# Patient Record
Sex: Female | Born: 1980
Health system: Southern US, Community
[De-identification: ages and names within clinical notes are randomized; demographics above are authoritative.]

---

## 2007-10-31 ENCOUNTER — Emergency Department (HOSPITAL_COMMUNITY): Admission: EM | Admit: 2007-10-31 | Discharge: 2007-10-31 | Payer: Self-pay | Admitting: Family Medicine

## 2007-11-01 ENCOUNTER — Emergency Department (HOSPITAL_COMMUNITY): Admission: EM | Admit: 2007-11-01 | Discharge: 2007-11-01 | Payer: Self-pay | Admitting: Family Medicine

## 2007-11-03 ENCOUNTER — Emergency Department (HOSPITAL_COMMUNITY): Admission: EM | Admit: 2007-11-03 | Discharge: 2007-11-03 | Payer: Self-pay | Admitting: Family Medicine

## 2007-11-05 ENCOUNTER — Emergency Department (HOSPITAL_COMMUNITY): Admission: EM | Admit: 2007-11-05 | Discharge: 2007-11-05 | Payer: Self-pay | Admitting: Family Medicine

## 2009-09-20 ENCOUNTER — Inpatient Hospital Stay (HOSPITAL_COMMUNITY): Admission: AD | Admit: 2009-09-20 | Discharge: 2009-09-22 | Payer: Self-pay | Admitting: Obstetrics and Gynecology

## 2010-04-29 LAB — RPR: RPR Ser Ql: NONREACTIVE

## 2010-04-29 LAB — CBC
HCT: 31.9 % — ABNORMAL LOW (ref 36.0–46.0)
Hemoglobin: 10.8 g/dL — ABNORMAL LOW (ref 12.0–15.0)
Hemoglobin: 12.9 g/dL (ref 12.0–15.0)
MCH: 31.4 pg (ref 26.0–34.0)
MCH: 31.5 pg (ref 26.0–34.0)
MCHC: 33.8 g/dL (ref 30.0–36.0)
MCHC: 34.2 g/dL (ref 30.0–36.0)
RDW: 13.9 % (ref 11.5–15.5)
RDW: 14.1 % (ref 11.5–15.5)

## 2010-11-14 LAB — CULTURE, ROUTINE-ABSCESS

## 2013-11-11 ENCOUNTER — Other Ambulatory Visit: Payer: Self-pay | Admitting: Family Medicine

## 2013-11-11 DIAGNOSIS — N63 Unspecified lump in unspecified breast: Secondary | ICD-10-CM

## 2013-11-13 ENCOUNTER — Other Ambulatory Visit: Payer: Self-pay | Admitting: Family Medicine

## 2013-11-13 DIAGNOSIS — N6011 Diffuse cystic mastopathy of right breast: Secondary | ICD-10-CM

## 2013-11-13 DIAGNOSIS — N63 Unspecified lump in unspecified breast: Secondary | ICD-10-CM

## 2013-11-14 ENCOUNTER — Ambulatory Visit
Admission: RE | Admit: 2013-11-14 | Discharge: 2013-11-14 | Disposition: A | Discharge status: Home / Self Care | Payer: 59 | Source: Ambulatory Visit | Attending: Family Medicine | Admitting: Family Medicine

## 2013-11-14 DIAGNOSIS — N6011 Diffuse cystic mastopathy of right breast: Secondary | ICD-10-CM

## 2013-11-14 DIAGNOSIS — N63 Unspecified lump in unspecified breast: Secondary | ICD-10-CM

## 2015-02-26 DIAGNOSIS — R5381 Other malaise: Secondary | ICD-10-CM | POA: Diagnosis not present

## 2015-02-26 DIAGNOSIS — R5383 Other fatigue: Secondary | ICD-10-CM | POA: Diagnosis not present

## 2015-03-31 DIAGNOSIS — R5383 Other fatigue: Secondary | ICD-10-CM | POA: Diagnosis not present

## 2015-03-31 MED FILL — PHENTERMINE 37.5 MG TABLET: 37.5 | 30 days supply | Qty: 30 | Fill #0

## 2015-04-29 DIAGNOSIS — R5383 Other fatigue: Secondary | ICD-10-CM | POA: Diagnosis not present

## 2015-04-30 MED FILL — PHENTERMINE 37.5 MG TABLET: 37.5 | 30 days supply | Qty: 30 | Fill #0

## 2015-06-03 DIAGNOSIS — R5383 Other fatigue: Secondary | ICD-10-CM | POA: Diagnosis not present

## 2015-06-09 MED FILL — FLUTICASONE PROP 50 MCG SPR: 50 | 30 days supply | Qty: 16 | Fill #0

## 2015-07-01 MED FILL — NUVARING VAGINAL RING: 0.12-0.015 | 84 days supply | Qty: 3 | Fill #0

## 2015-08-30 DIAGNOSIS — Z6832 Body mass index (BMI) 32.0-32.9, adult: Secondary | ICD-10-CM | POA: Diagnosis not present

## 2015-08-30 DIAGNOSIS — R87610 Atypical squamous cells of undetermined significance on cytologic smear of cervix (ASC-US): Secondary | ICD-10-CM | POA: Diagnosis not present

## 2015-08-30 DIAGNOSIS — Z01419 Encounter for gynecological examination (general) (routine) without abnormal findings: Secondary | ICD-10-CM | POA: Diagnosis not present

## 2015-08-30 DIAGNOSIS — F5101 Primary insomnia: Secondary | ICD-10-CM | POA: Diagnosis not present

## 2015-08-30 DIAGNOSIS — E669 Obesity, unspecified: Secondary | ICD-10-CM | POA: Diagnosis not present

## 2015-09-01 ENCOUNTER — Other Ambulatory Visit (HOSPITAL_COMMUNITY)
Admission: RE | Admit: 2015-09-01 | Discharge: 2015-09-01 | Disposition: A | Discharge status: Home / Self Care | Payer: 59 | Source: Ambulatory Visit | Attending: Family Medicine | Admitting: Family Medicine

## 2015-09-01 DIAGNOSIS — Z01419 Encounter for gynecological examination (general) (routine) without abnormal findings: Secondary | ICD-10-CM | POA: Diagnosis not present

## 2015-09-01 LAB — CBC WITH DIFFERENTIAL/PLATELET
BASOS ABS: 0 10*3/uL (ref 0.0–0.1)
Basophils Relative: 1 %
EOS PCT: 4 %
Eosinophils Absolute: 0.3 10*3/uL (ref 0.0–0.7)
HEMATOCRIT: 40.9 % (ref 36.0–46.0)
Hemoglobin: 13.8 g/dL (ref 12.0–15.0)
LYMPHS ABS: 2.4 10*3/uL (ref 0.7–4.0)
LYMPHS PCT: 36 %
MCH: 29 pg (ref 26.0–34.0)
MCHC: 33.7 g/dL (ref 30.0–36.0)
MCV: 85.9 fL (ref 78.0–100.0)
MONO ABS: 0.3 10*3/uL (ref 0.1–1.0)
Monocytes Relative: 5 %
NEUTROS ABS: 3.6 10*3/uL (ref 1.7–7.7)
Neutrophils Relative %: 54 %
PLATELETS: 223 10*3/uL (ref 150–400)
RBC: 4.76 MIL/uL (ref 3.87–5.11)
RDW: 13.2 % (ref 11.5–15.5)
WBC: 6.6 10*3/uL (ref 4.0–10.5)

## 2015-09-01 LAB — BASIC METABOLIC PANEL
Anion gap: 6 (ref 5–15)
BUN: 17 mg/dL (ref 6–20)
CHLORIDE: 107 mmol/L (ref 101–111)
CO2: 25 mmol/L (ref 22–32)
Calcium: 9.1 mg/dL (ref 8.9–10.3)
Creatinine, Ser: 0.91 mg/dL (ref 0.44–1.00)
GLUCOSE: 100 mg/dL — AB (ref 65–99)
Potassium: 4.2 mmol/L (ref 3.5–5.1)
Sodium: 138 mmol/L (ref 135–145)

## 2015-09-01 LAB — LIPID PANEL
CHOL/HDL RATIO: 3.6 ratio
Cholesterol: 214 mg/dL — ABNORMAL HIGH (ref 0–200)
HDL: 60 mg/dL (ref >40)
LDL CALC: 126 mg/dL — AB (ref 0–99)
Triglycerides: 142 mg/dL (ref <150)
VLDL: 28 mg/dL (ref 0–40)

## 2015-09-02 DIAGNOSIS — R5383 Other fatigue: Secondary | ICD-10-CM | POA: Diagnosis not present

## 2015-09-02 DIAGNOSIS — Z6832 Body mass index (BMI) 32.0-32.9, adult: Secondary | ICD-10-CM | POA: Diagnosis not present

## 2015-09-02 LAB — HEMOGLOBIN A1C
Hgb A1c MFr Bld: 5.3 % (ref 4.8–5.6)
Mean Plasma Glucose: 105 mg/dL

## 2015-09-22 MED FILL — traZODone HCL 50 MG TABS: 50 | 30 days supply | Qty: 30 | Fill #0

## 2015-09-27 DIAGNOSIS — R87628 Other abnormal cytological findings on specimens from vagina: Secondary | ICD-10-CM | POA: Diagnosis not present

## 2015-09-30 DIAGNOSIS — R5383 Other fatigue: Secondary | ICD-10-CM | POA: Diagnosis not present

## 2015-09-30 DIAGNOSIS — Z6830 Body mass index (BMI) 30.0-30.9, adult: Secondary | ICD-10-CM | POA: Diagnosis not present

## 2015-10-06 MED FILL — PHENTERMINE 37.5 MG TABLET: 37.5 | 30 days supply | Qty: 30 | Fill #0

## 2015-11-17 MED FILL — NUVARING VAGINAL RING: 0.12-0.015 | 84 days supply | Qty: 3 | Fill #0

## 2015-11-25 MED FILL — SCOPOLAMINE 1 MG/3 DAY PATC: 1 | 3 days supply | Qty: 2 | Fill #0

## 2015-11-25 MED FILL — MECLIZINE 25 MG TABLET: 25 | 7 days supply | Qty: 30 | Fill #0

## 2015-11-29 MED FILL — FLUTICASONE PROP 50 MCG SPR: 50 | 30 days supply | Qty: 16 | Fill #1

## 2015-11-30 DIAGNOSIS — R102 Pelvic and perineal pain: Secondary | ICD-10-CM | POA: Diagnosis not present

## 2016-01-20 DIAGNOSIS — H1089 Other conjunctivitis: Secondary | ICD-10-CM | POA: Diagnosis not present

## 2016-01-20 DIAGNOSIS — H16292 Other keratoconjunctivitis, left eye: Secondary | ICD-10-CM | POA: Diagnosis not present

## 2016-01-20 DIAGNOSIS — H168 Other keratitis: Secondary | ICD-10-CM | POA: Diagnosis not present

## 2016-01-20 MED FILL — NEO/POLYMYXIN/DEXAMETH DROP: 3.5-10000-0 | 8 days supply | Qty: 5 | Fill #0

## 2016-01-26 MED FILL — FLUTICASONE PROP 50 MCG SPR: 50 | 30 days supply | Qty: 16 | Fill #2

## 2016-03-21 DIAGNOSIS — Z6833 Body mass index (BMI) 33.0-33.9, adult: Secondary | ICD-10-CM | POA: Diagnosis not present

## 2016-03-21 DIAGNOSIS — R5381 Other malaise: Secondary | ICD-10-CM | POA: Diagnosis not present

## 2016-03-21 DIAGNOSIS — K5909 Other constipation: Secondary | ICD-10-CM | POA: Diagnosis not present

## 2016-04-18 DIAGNOSIS — R5383 Other fatigue: Secondary | ICD-10-CM | POA: Diagnosis not present

## 2016-04-18 DIAGNOSIS — Z6831 Body mass index (BMI) 31.0-31.9, adult: Secondary | ICD-10-CM | POA: Diagnosis not present

## 2016-05-23 DIAGNOSIS — R5383 Other fatigue: Secondary | ICD-10-CM | POA: Diagnosis not present

## 2016-05-23 DIAGNOSIS — Z6831 Body mass index (BMI) 31.0-31.9, adult: Secondary | ICD-10-CM | POA: Diagnosis not present

## 2016-06-16 DIAGNOSIS — N3000 Acute cystitis without hematuria: Secondary | ICD-10-CM | POA: Diagnosis not present

## 2016-06-20 DIAGNOSIS — R5383 Other fatigue: Secondary | ICD-10-CM | POA: Diagnosis not present

## 2016-06-20 DIAGNOSIS — Z6831 Body mass index (BMI) 31.0-31.9, adult: Secondary | ICD-10-CM | POA: Diagnosis not present

## 2016-06-30 MED FILL — FLUTICASONE PROP 50 MCG SPR: 50 | 30 days supply | Qty: 16 | Fill #0

## 2016-07-11 MED FILL — BELVIQ 10 MG TABLET: 10 | 30 days supply | Qty: 60 | Fill #0

## 2016-08-09 MED FILL — BELVIQ 10 MG TABLET: 10 | 30 days supply | Qty: 60 | Fill #1

## 2016-09-04 DIAGNOSIS — Z113 Encounter for screening for infections with a predominantly sexual mode of transmission: Secondary | ICD-10-CM | POA: Diagnosis not present

## 2016-09-04 DIAGNOSIS — Z01411 Encounter for gynecological examination (general) (routine) with abnormal findings: Secondary | ICD-10-CM | POA: Diagnosis not present

## 2016-09-04 DIAGNOSIS — R87610 Atypical squamous cells of undetermined significance on cytologic smear of cervix (ASC-US): Secondary | ICD-10-CM | POA: Diagnosis not present

## 2016-09-04 DIAGNOSIS — R87622 Low grade squamous intraepithelial lesion on cytologic smear of vagina (LGSIL): Secondary | ICD-10-CM | POA: Diagnosis not present

## 2016-09-04 DIAGNOSIS — Z1151 Encounter for screening for human papillomavirus (HPV): Secondary | ICD-10-CM | POA: Diagnosis not present

## 2016-09-04 DIAGNOSIS — Z118 Encounter for screening for other infectious and parasitic diseases: Secondary | ICD-10-CM | POA: Diagnosis not present

## 2016-09-04 DIAGNOSIS — Z6833 Body mass index (BMI) 33.0-33.9, adult: Secondary | ICD-10-CM | POA: Diagnosis not present

## 2016-09-04 DIAGNOSIS — Z01419 Encounter for gynecological examination (general) (routine) without abnormal findings: Secondary | ICD-10-CM | POA: Diagnosis not present

## 2016-09-06 ENCOUNTER — Other Ambulatory Visit (HOSPITAL_COMMUNITY)
Admission: RE | Admit: 2016-09-06 | Discharge: 2016-09-06 | Disposition: A | Discharge status: Home / Self Care | Payer: 59 | Source: Ambulatory Visit | Attending: Family Medicine | Admitting: Family Medicine

## 2016-09-06 DIAGNOSIS — Z01419 Encounter for gynecological examination (general) (routine) without abnormal findings: Secondary | ICD-10-CM | POA: Diagnosis not present

## 2016-09-06 LAB — CBC WITH DIFFERENTIAL/PLATELET
BASOS ABS: 0 10*3/uL (ref 0.0–0.1)
BASOS PCT: 1 %
Eosinophils Absolute: 0.1 10*3/uL (ref 0.0–0.7)
Eosinophils Relative: 1 %
HEMATOCRIT: 39.5 % (ref 36.0–46.0)
HEMOGLOBIN: 13.5 g/dL (ref 12.0–15.0)
Lymphocytes Relative: 41 %
Lymphs Abs: 2.5 10*3/uL (ref 0.7–4.0)
MCH: 29.5 pg (ref 26.0–34.0)
MCHC: 34.2 g/dL (ref 30.0–36.0)
MCV: 86.4 fL (ref 78.0–100.0)
Monocytes Absolute: 0.3 10*3/uL (ref 0.1–1.0)
Monocytes Relative: 5 %
NEUTROS ABS: 3.1 10*3/uL (ref 1.7–7.7)
NEUTROS PCT: 52 %
Platelets: 226 10*3/uL (ref 150–400)
RBC: 4.57 MIL/uL (ref 3.87–5.11)
RDW: 13.7 % (ref 11.5–15.5)
WBC: 6 10*3/uL (ref 4.0–10.5)

## 2016-09-06 LAB — BASIC METABOLIC PANEL
ANION GAP: 7 (ref 5–15)
BUN: 16 mg/dL (ref 6–20)
CALCIUM: 9 mg/dL (ref 8.9–10.3)
CO2: 26 mmol/L (ref 22–32)
Chloride: 104 mmol/L (ref 101–111)
Creatinine, Ser: 0.79 mg/dL (ref 0.44–1.00)
Glucose, Bld: 108 mg/dL — ABNORMAL HIGH (ref 65–99)
Potassium: 4 mmol/L (ref 3.5–5.1)
SODIUM: 137 mmol/L (ref 135–145)

## 2016-09-06 LAB — LIPID PANEL
CHOLESTEROL: 193 mg/dL (ref 0–200)
HDL: 59 mg/dL (ref >40)
LDL CALC: 120 mg/dL — AB (ref 0–99)
TRIGLYCERIDES: 72 mg/dL (ref <150)
Total CHOL/HDL Ratio: 3.3 RATIO
VLDL: 14 mg/dL (ref 0–40)

## 2016-09-07 LAB — HEMOGLOBIN A1C
HEMOGLOBIN A1C: 5.4 % (ref 4.8–5.6)
Mean Plasma Glucose: 108 mg/dL

## 2016-11-17 MED FILL — NUVARING VAGINAL RING: 0.12-0.015 | 84 days supply | Qty: 3 | Fill #0

## 2017-03-01 ENCOUNTER — Telehealth: Payer: 59 | Admitting: Family

## 2017-03-01 DIAGNOSIS — B9789 Other viral agents as the cause of diseases classified elsewhere: Secondary | ICD-10-CM | POA: Diagnosis not present

## 2017-03-01 DIAGNOSIS — J329 Chronic sinusitis, unspecified: Secondary | ICD-10-CM

## 2017-03-01 MED ORDER — FLUTICASONE PROPIONATE 50 MCG/ACT NA SUSP
2.0000 | Freq: Every day | NASAL | 6 refills | Status: AC
Start: 2017-03-01 — End: ?

## 2017-03-01 MED ORDER — PREDNISONE 5 MG PO TABS: 5.0000 mg | ORAL_TABLET | ORAL | 0 refills | Status: DC

## 2017-03-01 NOTE — Progress Notes (Signed)
Thank you for the details you included in the comment boxes. Those details are very helpful in determining the best course of treatment for you and help us to provide the best care.  We are sorry that you are not feeling well.  Here is how we plan to help!  Based on what you have shared with me it looks like you have sinusitis.  Sinusitis is inflammation and infection in the sinus cavities of the head.  Based on your presentation I believe you most likely have Acute Viral Sinusitis.This is an infection most likely caused by a virus. There is not specific treatment for viral sinusitis other than to help you with the symptoms until the infection runs its course.  You may use an oral decongestant such as Mucinex D or if you have glaucoma or high blood pressure use plain Mucinex. Saline nasal spray help and can safely be used as often as needed for congestion, I have prescribed: Fluticasone nasal spray two sprays in each nostril once a day And a Prednisone pack for inflammation and pain to help with all of the discomfort you are having.   Some authorities believe that zinc sprays or the use of Echinacea may shorten the course of your symptoms.  Sinus infections are not as easily transmitted as other respiratory infection, however we still recommend that you avoid close contact with loved ones, especially the very young and elderly.  Remember to wash your hands thoroughly throughout the day as this is the number one way to prevent the spread of infection!  Home Care:  Only take medications as instructed by your medical team.  Complete the entire course of an antibiotic.  Do not take these medications with alcohol.  A steam or ultrasonic humidifier can help congestion.  You can place a towel over your head and breathe in the steam from hot water coming from a faucet.  Avoid close contacts especially the very young and the elderly.  Cover your mouth when you cough or sneeze.  Always remember to wash  your hands.  Get Help Right Away If:  You develop worsening fever or sinus pain.  You develop a severe head ache or visual changes.  Your symptoms persist after you have completed your treatment plan.  Make sure you  Understand these instructions.  Will watch your condition.  Will get help right away if you are not doing well or get worse.  Your e-visit answers were reviewed by a board certified advanced clinical practitioner to complete your personal care plan.  Depending on the condition, your plan could have included both over the counter or prescription medications.  If there is a problem please reply  once you have received a response from your provider.  Your safety is important to us.  If you have drug allergies check your prescription carefully.    You can use MyChart to ask questions about today's visit, request a non-urgent call back, or ask for a work or school excuse for 24 hours related to this e-Visit. If it has been greater than 24 hours you will need to follow up with your provider, or enter a new e-Visit to address those concerns.  You will get an e-mail in the next two days asking about your experience.  I hope that your e-visit has been valuable and will speed your recovery. Thank you for using e-visits.

## 2017-07-12 MED FILL — NUVARING VAGINAL RING: 0.12-0.015 | 84 days supply | Qty: 3 | Fill #1

## 2017-08-17 MED FILL — FLUTICASONE PROP 50 MCG SPR: 50 | 30 days supply | Qty: 16 | Fill #0

## 2017-09-06 MED FILL — GENTAK 3 MG/ML EYE DROPS: 0.3 | 15 days supply | Qty: 15 | Fill #0

## 2017-09-08 ENCOUNTER — Other Ambulatory Visit (HOSPITAL_COMMUNITY)
Admit: 2017-09-08 | Discharge: 2017-09-08 | Disposition: A | Discharge status: Home / Self Care | Payer: No Typology Code available for payment source | Source: Other Acute Inpatient Hospital | Attending: Family Medicine | Admitting: Family Medicine

## 2017-09-08 DIAGNOSIS — E669 Obesity, unspecified: Secondary | ICD-10-CM | POA: Diagnosis present

## 2017-09-08 DIAGNOSIS — Z01419 Encounter for gynecological examination (general) (routine) without abnormal findings: Secondary | ICD-10-CM | POA: Diagnosis not present

## 2017-09-08 LAB — BASIC METABOLIC PANEL
Anion gap: 7 (ref 5–15)
BUN: 18 mg/dL (ref 6–20)
CHLORIDE: 106 mmol/L (ref 98–111)
CO2: 26 mmol/L (ref 22–32)
Calcium: 9.2 mg/dL (ref 8.9–10.3)
Creatinine, Ser: 0.89 mg/dL (ref 0.44–1.00)
GFR calc Af Amer: >60 mL/min (ref >60)
GFR calc non Af Amer: >60 mL/min (ref >60)
GLUCOSE: 106 mg/dL — AB (ref 70–99)
POTASSIUM: 4.1 mmol/L (ref 3.5–5.1)
Sodium: 139 mmol/L (ref 135–145)

## 2017-09-08 LAB — LIPID PANEL
Cholesterol: 216 mg/dL — ABNORMAL HIGH (ref 0–200)
HDL: 61 mg/dL (ref >40)
LDL Cholesterol: 141 mg/dL — ABNORMAL HIGH (ref 0–99)
TRIGLYCERIDES: 68 mg/dL (ref <150)
Total CHOL/HDL Ratio: 3.5 RATIO
VLDL: 14 mg/dL (ref 0–40)

## 2017-09-08 LAB — CBC
HCT: 42.5 % (ref 36.0–46.0)
HEMOGLOBIN: 14.2 g/dL (ref 12.0–15.0)
MCH: 29.2 pg (ref 26.0–34.0)
MCHC: 33.4 g/dL (ref 30.0–36.0)
MCV: 87.4 fL (ref 78.0–100.0)
Platelets: 201 10*3/uL (ref 150–400)
RBC: 4.86 MIL/uL (ref 3.87–5.11)
RDW: 13.7 % (ref 11.5–15.5)
WBC: 4.7 10*3/uL (ref 4.0–10.5)

## 2017-09-08 LAB — HEMOGLOBIN A1C
Hgb A1c MFr Bld: 5.4 % (ref 4.8–5.6)
MEAN PLASMA GLUCOSE: 108.28 mg/dL

## 2017-10-24 MED FILL — ZYLET EYE DROPS: 0.5-0.3 | 25 days supply | Qty: 5 | Fill #0

## 2018-02-07 MED FILL — NUVARING VAGINAL RING: 0.12-0.015 | 84 days supply | Qty: 3 | Fill #0

## 2018-02-27 MED FILL — ETONOGESTREL-ETHINYL ESTRAD: 0.12-0.015 | 84 days supply | Qty: 3 | Fill #0

## 2018-03-08 DIAGNOSIS — J0100 Acute maxillary sinusitis, unspecified: Secondary | ICD-10-CM | POA: Diagnosis not present

## 2018-03-08 DIAGNOSIS — R509 Fever, unspecified: Secondary | ICD-10-CM | POA: Diagnosis not present

## 2018-03-08 DIAGNOSIS — R110 Nausea: Secondary | ICD-10-CM | POA: Diagnosis not present

## 2018-03-08 MED FILL — AMOX-CLAV 875-125 MG TABLET: 875-125 | 10 days supply | Qty: 20 | Fill #0

## 2018-03-08 MED FILL — ONDANSETRON HCL 4 MG TABLET: 4 | 7 days supply | Qty: 30 | Fill #0

## 2018-03-08 MED FILL — FLUTICASONE PROP 50 MCG SPR: 50 | 30 days supply | Qty: 16 | Fill #0

## 2018-07-06 ENCOUNTER — Other Ambulatory Visit: Payer: Self-pay

## 2018-07-06 ENCOUNTER — Other Ambulatory Visit: Payer: Self-pay | Admitting: Occupational Medicine

## 2018-07-06 ENCOUNTER — Other Ambulatory Visit (HOSPITAL_COMMUNITY)
Admission: AD | Admit: 2018-07-06 | Discharge: 2018-07-06 | Disposition: A | Discharge status: Home / Self Care | Payer: No Typology Code available for payment source | Source: Ambulatory Visit | Attending: Internal Medicine | Admitting: Internal Medicine

## 2018-07-06 ENCOUNTER — Other Ambulatory Visit (HOSPITAL_COMMUNITY): Payer: Self-pay | Admitting: Emergency Medicine

## 2018-07-06 DIAGNOSIS — R067 Sneezing: Secondary | ICD-10-CM | POA: Diagnosis present

## 2018-07-06 DIAGNOSIS — Z1159 Encounter for screening for other viral diseases: Secondary | ICD-10-CM | POA: Diagnosis not present

## 2018-07-06 LAB — SARS CORONAVIRUS 2 BY RT PCR (HOSPITAL ORDER, PERFORMED IN ~~LOC~~ HOSPITAL LAB): SARS Coronavirus 2: NEGATIVE

## 2018-09-04 MED FILL — ETONOGESTREL-ETHINYL ESTRAD: 0.12-0.015 | 84 days supply | Qty: 3 | Fill #1

## 2018-12-09 MED FILL — ETONOGESTREL-ETHINYL ESTRAD: 0.12-0.015 | 84 days supply | Qty: 3 | Fill #2

## 2018-12-10 DIAGNOSIS — Z20828 Contact with and (suspected) exposure to other viral communicable diseases: Secondary | ICD-10-CM | POA: Diagnosis not present

## 2018-12-12 DIAGNOSIS — H16201 Unspecified keratoconjunctivitis, right eye: Secondary | ICD-10-CM | POA: Diagnosis not present

## 2019-04-24 DIAGNOSIS — L209 Atopic dermatitis, unspecified: Secondary | ICD-10-CM | POA: Diagnosis not present

## 2019-05-06 MED FILL — ETONOGESTREL-ETHINYL ESTRAD: 0.12-0.015 | 28 days supply | Qty: 1 | Fill #0

## 2019-05-08 MED FILL — TRIAMCINOLONE 0.025% CREAM: 0.025 | 7 days supply | Qty: 30 | Fill #0

## 2019-05-12 MED FILL — TRIAMCINOLONE 0.025% CREAM: 0.025 | 7 days supply | Qty: 30 | Fill #0

## 2019-05-28 ENCOUNTER — Other Ambulatory Visit (HOSPITAL_COMMUNITY): Payer: Self-pay | Admitting: Family Medicine

## 2019-05-28 DIAGNOSIS — F5101 Primary insomnia: Secondary | ICD-10-CM | POA: Diagnosis not present

## 2019-05-28 DIAGNOSIS — Z0000 Encounter for general adult medical examination without abnormal findings: Secondary | ICD-10-CM | POA: Diagnosis not present

## 2019-05-28 DIAGNOSIS — Z6831 Body mass index (BMI) 31.0-31.9, adult: Secondary | ICD-10-CM | POA: Diagnosis not present

## 2019-05-28 DIAGNOSIS — E669 Obesity, unspecified: Secondary | ICD-10-CM | POA: Diagnosis not present

## 2019-05-28 MED FILL — traZODone HCL 50 MG TABS: 50 | 60 days supply | Qty: 60 | Fill #0

## 2019-05-28 MED FILL — ETONOGESTREL-ETHINYL ESTRAD: 0.12-0.015 | 84 days supply | Qty: 3 | Fill #0

## 2019-05-28 MED FILL — FLUTICASONE PROP 50 MCG SPR: 50 | 90 days supply | Qty: 48 | Fill #0

## 2019-05-30 ENCOUNTER — Other Ambulatory Visit: Payer: Self-pay | Admitting: Family Medicine

## 2019-05-30 DIAGNOSIS — Z1231 Encounter for screening mammogram for malignant neoplasm of breast: Secondary | ICD-10-CM

## 2019-05-30 DIAGNOSIS — Z0000 Encounter for general adult medical examination without abnormal findings: Secondary | ICD-10-CM | POA: Diagnosis not present

## 2019-06-11 ENCOUNTER — Other Ambulatory Visit: Payer: Self-pay

## 2019-06-11 ENCOUNTER — Ambulatory Visit
Admission: RE | Admit: 2019-06-11 | Discharge: 2019-06-11 | Disposition: A | Discharge status: Home / Self Care | Payer: No Typology Code available for payment source | Source: Ambulatory Visit

## 2019-06-11 DIAGNOSIS — Z1231 Encounter for screening mammogram for malignant neoplasm of breast: Secondary | ICD-10-CM | POA: Diagnosis not present

## 2019-06-16 ENCOUNTER — Ambulatory Visit: Payer: No Typology Code available for payment source | Admitting: Dermatology

## 2019-07-21 DIAGNOSIS — N925 Other specified irregular menstruation: Secondary | ICD-10-CM | POA: Diagnosis not present

## 2019-07-29 DIAGNOSIS — Z20822 Contact with and (suspected) exposure to covid-19: Secondary | ICD-10-CM | POA: Diagnosis not present

## 2019-08-11 ENCOUNTER — Other Ambulatory Visit (HOSPITAL_COMMUNITY): Payer: Self-pay | Admitting: Specialist

## 2019-08-11 DIAGNOSIS — M542 Cervicalgia: Secondary | ICD-10-CM | POA: Diagnosis not present

## 2019-08-11 MED FILL — GABAPENTIN 300 MG CAPSULE: 300 | 30 days supply | Qty: 90 | Fill #0

## 2019-08-11 MED FILL — predniSONE 5 MG (21) TBPK: 5 | 6 days supply | Qty: 21 | Fill #0

## 2019-08-11 MED FILL — ETONOGESTREL-ETHINYL ESTRAD: 0.12-0.015 | 84 days supply | Qty: 3 | Fill #1

## 2019-10-27 ENCOUNTER — Emergency Department: Payer: No Typology Code available for payment source

## 2019-10-27 ENCOUNTER — Emergency Department: Admit: 2019-10-27 | Discharge status: Absent without leave | Payer: Self-pay

## 2019-10-27 ENCOUNTER — Emergency Department
Admission: EM | Admit: 2019-10-27 | Discharge: 2019-10-27 | Disposition: A | Discharge status: Home / Self Care | Payer: 59 | Source: Home / Self Care

## 2019-10-27 ENCOUNTER — Other Ambulatory Visit: Payer: Self-pay

## 2019-10-27 DIAGNOSIS — M79661 Pain in right lower leg: Secondary | ICD-10-CM

## 2019-10-27 DIAGNOSIS — R079 Chest pain, unspecified: Secondary | ICD-10-CM | POA: Diagnosis not present

## 2019-10-27 DIAGNOSIS — M7989 Other specified soft tissue disorders: Secondary | ICD-10-CM

## 2019-10-27 DIAGNOSIS — R0602 Shortness of breath: Secondary | ICD-10-CM

## 2019-10-27 DIAGNOSIS — R600 Localized edema: Secondary | ICD-10-CM | POA: Diagnosis not present

## 2019-10-27 DIAGNOSIS — M79671 Pain in right foot: Secondary | ICD-10-CM

## 2019-10-27 NOTE — ED Provider Notes (Signed)
[facility]    CSN: [id number] Arrival date & time: 10/27/19  1158      History   Chief Complaint Chief Complaint  Patient presents with  . Chest Pain  . Foot Pain    HPI Stacy Rodriguez is a 39 y.o. female.   HPI Stacy Rodriguez is a 39 y.o. female presenting to UC with c/o Right dorsal foot pain that radiates up Right anterior and lateral aspect of her leg. Associated intermittent shortness of breath and aching chest pain that lasts a few seconds to minutes at a time 3 days ago.  Denies having chest pain at this time but does report mild SOB. Denies cough, congestion, fever, chills, n/v/d. No hx of asthma. Pt did take gabapentin this morning, foot pain has since resolved but pt wants to make sure she does not have a DVT causing her symptoms. Denies hx of blood clots. No recent travel or surgery.    History reviewed. No pertinent past medical history.  There are no problems to display for this patient.   History reviewed. No pertinent surgical history.  OB History   No obstetric history on file.      Home Medications    Prior to Admission medications   Medication Sig Start Date End Date Taking? Authorizing Provider  gabapentin (NEURONTIN) 300 MG capsule Take 300 mg by mouth. PRN   Yes [provider]  fluticasone (FLONASE) 50 MCG/ACT nasal spray Place 2 sprays into both nostrils daily. 03/01/17   Withrow, Everardo All, FNP  predniSONE (DELTASONE) 5 MG tablet Take 1 tablet (5 mg total) by mouth as directed. sterapred generic taper 03/01/17   Withrow, Everardo All, FNP    Family History Family History  Problem Relation Age of Onset  . Diabetes Mother   . Heart failure Mother   . Hypertension Mother   . Hyperlipidemia Mother     Social History Social History   Tobacco Use  . Smoking status: Never Smoker  . Smokeless tobacco: Never Used  Substance Use Topics  . Alcohol use: Yes    Comment: occ  . Drug use: Not on file     Allergies   Patient  has no known allergies.   Review of Systems Review of Systems  Constitutional: Negative for chills and fever.  HENT: Negative for congestion, ear pain, sore throat, trouble swallowing and voice change.   Respiratory: Positive for shortness of breath. Negative for cough.   Cardiovascular: Positive for chest pain. Negative for palpitations and leg swelling.  Gastrointestinal: Negative for abdominal pain, diarrhea, nausea and vomiting.  Musculoskeletal: Positive for arthralgias and myalgias. Negative for back pain.  Skin: Negative for color change and rash.  Neurological: Negative for weakness and numbness.  All other systems reviewed and are negative.    Physical Exam Triage Vital Signs ED Triage Vitals  Enc Vitals Group     BP 10/27/19 1217 130/79     Pulse Rate 10/27/19 1217 68     Resp 10/27/19 1217 16     Temp 10/27/19 1217 98.3 F (36.8 C)     Temp Source 10/27/19 1217 Oral     SpO2 10/27/19 1217 100 %     Weight --      Height --      Head Circumference --      Peak Flow --      Pain Score 10/27/19 1215 0     Pain Loc --      Pain  Edu? --      Excl. in GC? --    No data found.  Updated Vital Signs BP 130/79 (BP Location: Left Arm)   Pulse 68   Temp 98.3 F (36.8 C) (Oral)   Resp 16   SpO2 100%   Visual Acuity Right Eye Distance:   Left Eye Distance:   Bilateral Distance:    Right Eye Near:   Left Eye Near:    Bilateral Near:     Physical Exam Vitals and nursing note reviewed.  Constitutional:      General: She is not in acute distress.    Appearance: She is well-developed. She is not ill-appearing, toxic-appearing or diaphoretic.  HENT:     Head: Normocephalic and atraumatic.     Right Ear: Tympanic membrane and ear canal normal.     Left Ear: Tympanic membrane and ear canal normal.  Cardiovascular:     Rate and Rhythm: Normal rate and regular rhythm.  Pulmonary:     Effort: Pulmonary effort is normal.     Breath sounds: No decreased breath  sounds, wheezing, rhonchi or rales.  Chest:     Chest wall: No tenderness.  Abdominal:     Palpations: Abdomen is soft.     Tenderness: There is no abdominal tenderness.  Musculoskeletal:        General: Normal range of motion.     Cervical back: Normal range of motion.     Right lower leg: Tenderness ( Right foot: mild on dorsal aspect Right lower leg: mild anterior and lateral muscular tenderness) present. No edema.     Left lower leg: No tenderness. No edema.  Skin:    General: Skin is warm and dry.  Neurological:     Mental Status: She is alert and oriented to person, place, and time.  Psychiatric:        Behavior: Behavior normal.      UC Treatments / Results  Labs (all labs ordered are listed, but only abnormal results are displayed) Labs Reviewed - No data to display  EKG Date/Time:10/27/2019   12:21:41 Ventricular Rate: 64 PR Interval: 124 QRS Duration: 96 QT Interval: 414 QTC Calculation: 427 P-R-T axes: 52   62    62 Text Interpretation: Normal sinus rhythm, normal ECG    Radiology No results found.  Procedures Procedures (including critical care time)  Medications Ordered in UC Medications - No data to display  Initial Impression / Assessment and Plan / UC Course  I have reviewed the triage vital signs and the nursing notes.  Pertinent labs & imaging results that were available during my care of the patient were reviewed by me and considered in my medical decision making (see chart for details).    Reassured pt of normal [place] and EKG Lungs: CTAB, O2 Sat 100% and pulse 68 Encouraged close f/u with PCP Discussed symptoms that warrant emergent care in the ED. AVS given  Final Clinical Impressions(s) / UC Diagnoses   Final diagnoses:  Intermittent chest pain  Shortness of breath  Right foot pain  Pain and swelling of right lower leg     Discharge Instructions      Call to schedule a follow up appointment with a primary care provider  for recheck of symptoms if they keep recurring this week.   Call 911 or have someone drive you to the hospital if symptoms significantly worsening.     ED Prescriptions    None     PDMP not  reviewed this encounter.   Lurene Shadow, [place] 10/30/19 [phone]

## 2019-10-27 NOTE — ED Triage Notes (Signed)
Patient presents to Urgent Care with complaints of right foot pain that radiates up into her leg, intermittent shortness of breath and cp since three days ago. Patient reports the chest pain is not present at this time, and shortness of breath is mild.  CP lasts a couple minutes at a time. Pt took gabapentin this morning and the foot pain has subsided, wanted to be evaluated for a possible DVT.

## 2019-10-27 NOTE — Discharge Instructions (Signed)
  Call to schedule a follow up appointment with a primary care provider for recheck of symptoms if they keep recurring this week.   Call 911 or have someone drive you to the hospital if symptoms significantly worsening.

## 2019-10-28 DIAGNOSIS — Z20828 Contact with and (suspected) exposure to other viral communicable diseases: Secondary | ICD-10-CM | POA: Diagnosis not present

## 2019-11-21 MED FILL — ETONOGESTREL-ETHINYL ESTRAD: 0.12-0.015 | 84 days supply | Qty: 3 | Fill #2

## 2019-12-13 MED FILL — ETONOGESTREL-ETHINYL ESTRAD: 0.12-0.015 | 84 days supply | Qty: 3 | Fill #2

## 2019-12-25 ENCOUNTER — Other Ambulatory Visit (HOSPITAL_COMMUNITY): Payer: Self-pay | Admitting: Obstetrics and Gynecology

## 2019-12-25 DIAGNOSIS — Z01419 Encounter for gynecological examination (general) (routine) without abnormal findings: Secondary | ICD-10-CM | POA: Diagnosis not present

## 2019-12-25 DIAGNOSIS — Z124 Encounter for screening for malignant neoplasm of cervix: Secondary | ICD-10-CM | POA: Diagnosis not present

## 2019-12-30 ENCOUNTER — Ambulatory Visit: Payer: Self-pay | Admitting: Podiatry

## 2019-12-31 ENCOUNTER — Ambulatory Visit: Payer: No Typology Code available for payment source | Admitting: Sports Medicine

## 2019-12-31 ENCOUNTER — Other Ambulatory Visit: Payer: Self-pay

## 2019-12-31 ENCOUNTER — Encounter: Payer: Self-pay | Admitting: Sports Medicine

## 2019-12-31 DIAGNOSIS — M2142 Flat foot [pes planus] (acquired), left foot: Secondary | ICD-10-CM | POA: Diagnosis not present

## 2019-12-31 DIAGNOSIS — M79671 Pain in right foot: Secondary | ICD-10-CM | POA: Diagnosis not present

## 2019-12-31 DIAGNOSIS — M2141 Flat foot [pes planus] (acquired), right foot: Secondary | ICD-10-CM | POA: Diagnosis not present

## 2019-12-31 DIAGNOSIS — M779 Enthesopathy, unspecified: Secondary | ICD-10-CM | POA: Diagnosis not present

## 2019-12-31 NOTE — Progress Notes (Signed)
Subjective: Stacy Rodriguez is a 39 y.o. female patient who presents to office for evaluation of right foot pain. Patient complains of progressive pain especially over the last 3 months in the top of right foot that is off and on reports that is feeling better today occasionally gets sharp shooting pains or throbbing to the top of the foot and ankle that sometimes radiates up the leg.  Patient reports that she was thinking it was something muscle or tingling no joint related and ended up getting a ultrasound that was negative.  Patient reports that pain is worse sometimes after activities currently works 3, 12-hour shifts weekly.  Patient reports that she does a lot of standing and walking but denies any known injury that could have added to her problem.  No other issues noted.   Review of systems noncontributory.   There are no problems to display for this patient.   Current Outpatient Medications on File Prior to Visit  Medication Sig Dispense Refill   Loratadine (CLARITIN PO) Take by mouth.     etonogestrel-ethinyl estradiol (NUVARING) 0.12-0.015 MG/24HR vaginal ring Place vaginally.     fluticasone (FLONASE) 50 MCG/ACT nasal spray Place 2 sprays into both nostrils daily. 16 g 6   gabapentin (NEURONTIN) 300 MG capsule Take 300 mg by mouth. PRN     No current facility-administered medications on file prior to visit.    No Known Allergies  Objective:  General: Alert and oriented x3 in no acute distress  Dermatology: No open lesions bilateral lower extremities, no webspace macerations, no ecchymosis bilateral, all nails x 10 are well manicured.  Vascular: Dorsalis Pedis and Posterior Tibial pedal pulses palpable, Capillary Fill Time 3 seconds,(+) pedal hair growth bilateral, no edema bilateral lower extremities, Temperature gradient within normal limits.  Neurology: Michaell Cowing sensation intact via light touch bilateral.  Musculoskeletal: Mild limited ankle joint range of motion with  flexible pes planus deformity noted no reproducible tenderness to palpation bilateral however subjectively patient has pain along the peroneal tendon course dorsal lateral foot and ankle likely could be related to mechanics since there is no known history of injury on the right foot and ankle.   Assessment and Plan: Problem List Items Addressed This Visit    None    Visit Diagnoses    Right foot pain    -  Primary   Tendonitis       Pes planus of both feet           -Complete examination performed -Xrays not performed this visit however advised patient if her symptoms do not get better may benefit from x-rays for further evaluation -Discussed treatement options for possible mechanical foot pain related secondary to foot type -Rx ankle gauntlet for patient to use as instructed and recommend good supportive shoes -Recommend rest ice elevation and topical and or oral over-the-counter anti-inflammatories as needed for pain -Advised patient to closely monitor pain if worsens or persists to call office otherwise may follow-up if still painful in 1 month if not painful may cancel appointment   Asencion Islam, DPM

## 2020-02-02 DIAGNOSIS — Z20822 Contact with and (suspected) exposure to covid-19: Secondary | ICD-10-CM | POA: Diagnosis not present

## 2020-02-05 ENCOUNTER — Other Ambulatory Visit: Payer: No Typology Code available for payment source

## 2020-03-02 ENCOUNTER — Encounter: Payer: Self-pay | Admitting: Dermatology

## 2020-03-02 ENCOUNTER — Other Ambulatory Visit (HOSPITAL_COMMUNITY): Payer: Self-pay | Admitting: Specialist

## 2020-03-02 ENCOUNTER — Other Ambulatory Visit: Payer: Self-pay

## 2020-03-02 ENCOUNTER — Ambulatory Visit: Payer: 59 | Admitting: Dermatology

## 2020-03-02 DIAGNOSIS — L700 Acne vulgaris: Secondary | ICD-10-CM | POA: Diagnosis not present

## 2020-03-02 DIAGNOSIS — L853 Xerosis cutis: Secondary | ICD-10-CM | POA: Diagnosis not present

## 2020-03-02 MED ORDER — AMZEEQ 4 % EX FOAM: 1.0000 "application " | Freq: Every day | CUTANEOUS | 6 refills | Status: AC

## 2020-03-02 MED FILL — GABAPENTIN 300 MG CAPSULE: 300 | 30 days supply | Qty: 30 | Fill #0

## 2020-03-02 MED FILL — ETONOGESTREL-ETHINYL ESTRAD: 0.12-0.015 | 84 days supply | Qty: 3 | Fill #0

## 2020-03-02 MED FILL — predniSONE 5 MG (21) TBPK: 5 | 6 days supply | Qty: 21 | Fill #1

## 2020-03-03 ENCOUNTER — Encounter: Payer: Self-pay | Admitting: Dermatology

## 2020-03-03 NOTE — Progress Notes (Addendum)
     New Patient   Subjective  Stacy Rodriguez is a 40 y.o. female who presents for the following: Acne (Face & Chest acne, otc acne wash, dry skin dry skin on face also).  Acne plus dry skin Location: Face and upper torso Duration:  Quality:  Associated Signs/Symptoms: Modifying Factors:  Severity:  Timing: Context:    The following portions of the chart were reviewed this encounter and updated as appropriate:  Tobacco  Allergies  Meds  Problems  Med Hx  Surg Hx  Fam Hx      Objective  Well appearing patient in no apparent distress; mood and affect are within normal limits. Objective  Head - Anterior (Face): 2+ dozen inflammatory mostly small papules face more than upper torso.  Mild to moderate PIH.  Objective  Neck - Anterior: History of skin being sensitive and dry.   A focused examination was performed including Head, neck, upper torso.. Relevant physical exam findings are noted in the Assessment and Plan.   Assessment & Plan  Acne vulgaris Head - Anterior (Face)  Essentially all treatment options discussed.  We will begin with bland mask (unless out-of-pocket cost too high) nightly on areas prone to get bumps.  Use over-the-counter Differin gel or generic adapalene twice weekly on the same areas.  Ordered Medications: Minocycline HCl Micronized (AMZEEQ) 4 % FOAM  Dry skin Neck - Anterior  She may try either CeraVe lotion or purpose cream on areas that are both dry and prone to acne.    Subjective  Stacy Rodriguez is a 40 y.o. female who presents for the following: Acne (Face & Chest acne, otc acne wash, dry skin dry skin on face also).      I, Janalyn Harder, MD, have reviewed all documentation for this visit.  The documentation on 03/08/20 for the exam, diagnosis, procedures, and orders are all accurate and complete.

## 2020-04-29 ENCOUNTER — Ambulatory Visit: Payer: 59 | Admitting: Dermatology

## 2020-05-18 ENCOUNTER — Other Ambulatory Visit: Payer: Self-pay | Admitting: Obstetrics and Gynecology

## 2020-05-18 DIAGNOSIS — Z1231 Encounter for screening mammogram for malignant neoplasm of breast: Secondary | ICD-10-CM

## 2020-05-27 ENCOUNTER — Other Ambulatory Visit (HOSPITAL_COMMUNITY): Payer: Self-pay

## 2020-05-27 DIAGNOSIS — Z0000 Encounter for general adult medical examination without abnormal findings: Secondary | ICD-10-CM | POA: Diagnosis not present

## 2020-05-27 DIAGNOSIS — Z6832 Body mass index (BMI) 32.0-32.9, adult: Secondary | ICD-10-CM | POA: Diagnosis not present

## 2020-05-27 DIAGNOSIS — E669 Obesity, unspecified: Secondary | ICD-10-CM | POA: Diagnosis not present

## 2020-05-27 MED ORDER — FLUTICASONE PROPIONATE 50 MCG/ACT NA SUSP
1.0000 | Freq: Every day | NASAL | 3 refills | Status: DC
  Filled 2020-05-27 – 2020-07-21 (×2): qty 48, 90d supply, fill #0

## 2020-05-27 MED ORDER — ETONOGESTREL-ETHINYL ESTRADIOL 0.12-0.015 MG/24HR VA RING
VAGINAL_RING | VAGINAL | 3 refills | Status: DC
  Filled 2020-05-27 – 2020-07-21 (×2): qty 3, 84d supply, fill #0
  Filled 2020-12-24: qty 3, 84d supply, fill #1
  Filled 2021-05-23: qty 3, 84d supply, fill #2

## 2020-05-31 ENCOUNTER — Ambulatory Visit: Payer: 59 | Admitting: Dermatology

## 2020-06-04 ENCOUNTER — Other Ambulatory Visit (HOSPITAL_COMMUNITY): Payer: Self-pay

## 2020-07-08 ENCOUNTER — Other Ambulatory Visit: Payer: Self-pay

## 2020-07-08 ENCOUNTER — Ambulatory Visit
Admission: RE | Admit: 2020-07-08 | Discharge: 2020-07-08 | Disposition: A | Discharge status: Home / Self Care | Payer: 59 | Source: Ambulatory Visit | Attending: Obstetrics and Gynecology | Admitting: Obstetrics and Gynecology

## 2020-07-08 DIAGNOSIS — Z1231 Encounter for screening mammogram for malignant neoplasm of breast: Secondary | ICD-10-CM | POA: Diagnosis not present

## 2020-07-21 ENCOUNTER — Other Ambulatory Visit (HOSPITAL_COMMUNITY): Payer: Self-pay

## 2020-08-18 ENCOUNTER — Other Ambulatory Visit (HOSPITAL_COMMUNITY): Payer: Self-pay

## 2020-08-18 MED ORDER — ONDANSETRON 4 MG PO TBDP
ORAL_TABLET | ORAL | 0 refills | Status: AC
  Filled 2020-08-18: qty 30, 7d supply, fill #0

## 2020-08-18 MED ORDER — SCOPOLAMINE 1 MG/3DAYS TD PT72
MEDICATED_PATCH | TRANSDERMAL | 0 refills | Status: AC
  Filled 2020-08-18: qty 2, 6d supply, fill #0

## 2020-08-24 ENCOUNTER — Other Ambulatory Visit (HOSPITAL_COMMUNITY): Payer: Self-pay

## 2020-08-24 MED ORDER — AZITHROMYCIN 500 MG PO TABS
ORAL_TABLET | ORAL | 0 refills | Status: AC
  Filled 2020-08-24: qty 4, 3d supply, fill #0

## 2020-08-24 MED ORDER — HYDROCORTISONE 2.5 % EX CREA
1.0000 "application " | TOPICAL_CREAM | CUTANEOUS | 0 refills | Status: AC
  Filled 2020-08-24: qty 30, 30d supply, fill #0

## 2020-08-24 MED ORDER — ATOVAQUONE-PROGUANIL HCL 250-100 MG PO TABS
ORAL_TABLET | ORAL | 0 refills | Status: AC
  Filled 2020-08-24: qty 15, 15d supply, fill #0

## 2020-09-23 ENCOUNTER — Other Ambulatory Visit (HOSPITAL_COMMUNITY): Payer: Self-pay

## 2020-09-23 DIAGNOSIS — M25511 Pain in right shoulder: Secondary | ICD-10-CM | POA: Diagnosis not present

## 2020-09-23 DIAGNOSIS — M542 Cervicalgia: Secondary | ICD-10-CM | POA: Diagnosis not present

## 2020-09-23 MED ORDER — PREDNISONE 5 MG (21) PO TBPK
ORAL_TABLET | ORAL | 0 refills | Status: AC
  Filled 2020-09-23: qty 21, 6d supply, fill #0

## 2020-09-23 MED ORDER — GABAPENTIN 300 MG PO CAPS
300.0000 mg | ORAL_CAPSULE | Freq: Three times a day (TID) | ORAL | 0 refills | Status: AC | PRN
  Filled 2020-09-23: qty 60, 23d supply, fill #0

## 2020-12-24 ENCOUNTER — Other Ambulatory Visit (HOSPITAL_COMMUNITY): Payer: Self-pay

## 2020-12-27 DIAGNOSIS — Z01419 Encounter for gynecological examination (general) (routine) without abnormal findings: Secondary | ICD-10-CM | POA: Diagnosis not present

## 2021-01-12 DIAGNOSIS — L209 Atopic dermatitis, unspecified: Secondary | ICD-10-CM | POA: Diagnosis not present

## 2021-02-15 ENCOUNTER — Other Ambulatory Visit (HOSPITAL_COMMUNITY): Payer: Self-pay

## 2021-02-15 MED ORDER — TRIAMCINOLONE ACETONIDE 0.025 % EX CREA
TOPICAL_CREAM | CUTANEOUS | 0 refills | Status: AC
  Filled 2021-02-15: qty 30, 5d supply, fill #0
  Filled 2021-02-24: qty 30, 30d supply, fill #0

## 2021-02-23 ENCOUNTER — Other Ambulatory Visit (HOSPITAL_COMMUNITY): Payer: Self-pay

## 2021-02-24 ENCOUNTER — Other Ambulatory Visit (HOSPITAL_COMMUNITY): Payer: Self-pay

## 2021-04-05 DIAGNOSIS — R5382 Chronic fatigue, unspecified: Secondary | ICD-10-CM | POA: Diagnosis not present

## 2021-04-05 DIAGNOSIS — R0602 Shortness of breath: Secondary | ICD-10-CM | POA: Diagnosis not present

## 2021-04-06 ENCOUNTER — Other Ambulatory Visit (HOSPITAL_COMMUNITY): Payer: Self-pay

## 2021-04-06 MED ORDER — ONDANSETRON HCL 4 MG PO TABS
ORAL_TABLET | ORAL | 0 refills | Status: AC
  Filled 2021-04-06: qty 30, 30d supply, fill #0

## 2021-04-06 MED ORDER — WEGOVY 0.5 MG/0.5ML ~~LOC~~ SOAJ
SUBCUTANEOUS | 0 refills | Status: AC
  Filled 2021-04-06: qty 2, 28d supply, fill #0

## 2021-04-13 ENCOUNTER — Other Ambulatory Visit (HOSPITAL_COMMUNITY): Payer: Self-pay

## 2021-04-18 DIAGNOSIS — E669 Obesity, unspecified: Secondary | ICD-10-CM | POA: Diagnosis not present

## 2021-05-04 ENCOUNTER — Other Ambulatory Visit (HOSPITAL_COMMUNITY): Payer: Self-pay

## 2021-05-04 DIAGNOSIS — R5382 Chronic fatigue, unspecified: Secondary | ICD-10-CM | POA: Diagnosis not present

## 2021-05-04 MED ORDER — WEGOVY 1 MG/0.5ML ~~LOC~~ SOAJ
SUBCUTANEOUS | 0 refills | Status: AC
  Filled 2021-05-04 – 2021-05-23 (×2): qty 2, 28d supply, fill #0

## 2021-05-13 ENCOUNTER — Other Ambulatory Visit (HOSPITAL_COMMUNITY): Payer: Self-pay

## 2021-05-23 ENCOUNTER — Other Ambulatory Visit (HOSPITAL_COMMUNITY): Payer: Self-pay

## 2021-05-30 ENCOUNTER — Other Ambulatory Visit (HOSPITAL_COMMUNITY): Payer: Self-pay

## 2021-05-30 DIAGNOSIS — Z6831 Body mass index (BMI) 31.0-31.9, adult: Secondary | ICD-10-CM | POA: Diagnosis not present

## 2021-05-30 DIAGNOSIS — Z0000 Encounter for general adult medical examination without abnormal findings: Secondary | ICD-10-CM | POA: Diagnosis not present

## 2021-05-30 DIAGNOSIS — E669 Obesity, unspecified: Secondary | ICD-10-CM | POA: Diagnosis not present

## 2021-05-30 MED ORDER — ETONOGESTREL-ETHINYL ESTRADIOL 0.12-0.015 MG/24HR VA RING
VAGINAL_RING | VAGINAL | 3 refills | Status: AC
  Filled 2021-05-30 – 2021-09-13 (×3): qty 3, 84d supply, fill #0
  Filled 2022-03-27: qty 3, 84d supply, fill #1

## 2021-06-06 ENCOUNTER — Other Ambulatory Visit (HOSPITAL_COMMUNITY): Payer: Self-pay

## 2021-06-06 MED ORDER — WEGOVY 1.7 MG/0.75ML ~~LOC~~ SOAJ
SUBCUTANEOUS | 0 refills | Status: AC
  Filled 2021-06-06 – 2021-06-20 (×2): qty 3, 28d supply, fill #0

## 2021-06-15 ENCOUNTER — Other Ambulatory Visit (HOSPITAL_COMMUNITY): Payer: Self-pay

## 2021-06-20 ENCOUNTER — Other Ambulatory Visit (HOSPITAL_COMMUNITY): Payer: Self-pay

## 2021-06-29 ENCOUNTER — Other Ambulatory Visit: Payer: Self-pay | Admitting: Obstetrics and Gynecology

## 2021-06-29 DIAGNOSIS — Z1231 Encounter for screening mammogram for malignant neoplasm of breast: Secondary | ICD-10-CM

## 2021-07-13 ENCOUNTER — Ambulatory Visit
Admission: RE | Admit: 2021-07-13 | Discharge: 2021-07-13 | Disposition: A | Discharge status: Home / Self Care | Payer: 59 | Source: Ambulatory Visit | Attending: Obstetrics and Gynecology | Admitting: Obstetrics and Gynecology

## 2021-07-13 DIAGNOSIS — Z1231 Encounter for screening mammogram for malignant neoplasm of breast: Secondary | ICD-10-CM

## 2021-07-14 ENCOUNTER — Other Ambulatory Visit (HOSPITAL_COMMUNITY): Payer: Self-pay

## 2021-07-14 MED ORDER — WEGOVY 2.4 MG/0.75ML ~~LOC~~ SOAJ
SUBCUTANEOUS | 0 refills | Status: DC
  Filled 2021-07-14: qty 3, 28d supply, fill #0

## 2021-07-18 ENCOUNTER — Other Ambulatory Visit (HOSPITAL_COMMUNITY): Payer: Self-pay

## 2021-07-19 ENCOUNTER — Other Ambulatory Visit (HOSPITAL_COMMUNITY): Payer: Self-pay

## 2021-07-20 ENCOUNTER — Other Ambulatory Visit (HOSPITAL_COMMUNITY): Payer: Self-pay

## 2021-08-11 ENCOUNTER — Other Ambulatory Visit (HOSPITAL_COMMUNITY): Payer: Self-pay

## 2021-08-11 DIAGNOSIS — R5383 Other fatigue: Secondary | ICD-10-CM | POA: Diagnosis not present

## 2021-08-11 MED ORDER — WEGOVY 2.4 MG/0.75ML ~~LOC~~ SOAJ
SUBCUTANEOUS | 2 refills | Status: DC
  Filled 2021-08-11: qty 3, 28d supply, fill #0
  Filled 2021-09-13: qty 3, 28d supply, fill #1
  Filled 2021-10-07: qty 3, 28d supply, fill #2

## 2021-09-13 ENCOUNTER — Other Ambulatory Visit (HOSPITAL_COMMUNITY): Payer: Self-pay

## 2021-09-13 MED ORDER — ETONOGESTREL-ETHINYL ESTRADIOL 0.12-0.015 MG/24HR VA RING
VAGINAL_RING | VAGINAL | 1 refills | Status: AC
  Filled 2021-11-29: qty 3, 28d supply, fill #0
  Filled 2022-09-13: qty 3, 84d supply, fill #1

## 2021-09-13 MED ORDER — FLUTICASONE PROPIONATE 50 MCG/ACT NA SUSP
1.0000 | Freq: Every day | NASAL | 3 refills | Status: DC
  Filled 2021-09-13 – 2021-11-29 (×2): qty 48, 90d supply, fill #0
  Filled 2022-03-27: qty 48, 90d supply, fill #1

## 2021-09-14 ENCOUNTER — Other Ambulatory Visit (HOSPITAL_COMMUNITY): Payer: Self-pay

## 2021-09-23 ENCOUNTER — Other Ambulatory Visit (HOSPITAL_COMMUNITY): Payer: Self-pay

## 2021-09-30 ENCOUNTER — Other Ambulatory Visit (HOSPITAL_COMMUNITY): Payer: Self-pay

## 2021-10-03 ENCOUNTER — Other Ambulatory Visit (HOSPITAL_COMMUNITY): Payer: Self-pay

## 2021-10-04 ENCOUNTER — Other Ambulatory Visit (HOSPITAL_COMMUNITY): Payer: Self-pay

## 2021-10-07 ENCOUNTER — Other Ambulatory Visit (HOSPITAL_COMMUNITY): Payer: Self-pay

## 2021-11-01 ENCOUNTER — Other Ambulatory Visit (HOSPITAL_COMMUNITY): Payer: Self-pay

## 2021-11-01 DIAGNOSIS — D518 Other vitamin B12 deficiency anemias: Secondary | ICD-10-CM | POA: Diagnosis not present

## 2021-11-01 DIAGNOSIS — R5383 Other fatigue: Secondary | ICD-10-CM | POA: Diagnosis not present

## 2021-11-01 DIAGNOSIS — Z6834 Body mass index (BMI) 34.0-34.9, adult: Secondary | ICD-10-CM | POA: Diagnosis not present

## 2021-11-01 MED ORDER — ONDANSETRON HCL 4 MG PO TABS
4.0000 mg | ORAL_TABLET | Freq: Every day | ORAL | 0 refills | Status: AC
  Filled 2021-11-01: qty 30, 30d supply, fill #0

## 2021-11-01 MED ORDER — WEGOVY 2.4 MG/0.75ML ~~LOC~~ SOAJ
2.4000 mg | SUBCUTANEOUS | 2 refills | Status: DC
  Filled 2021-11-01: qty 3, 28d supply, fill #0
  Filled 2021-11-29: qty 3, 28d supply, fill #1
  Filled 2021-12-28: qty 3, 28d supply, fill #2

## 2021-11-15 IMAGING — US US EXTREM LOW VENOUS*R*
1 series · 13 of 24 positions shown · non-contrast
Comparison: None.

CLINICAL DATA: Lower extremity pain and edema

EXAM:
RIGHT LOWER EXTREMITY VENOUS DUPLEX ULTRASOUND
TECHNIQUE: Gray-scale sonography with graded compression, as well as color
Doppler and duplex ultrasound were performed to evaluate the right
lower extremity deep venous system from the level of the common
femoral vein and including the common femoral, femoral, profunda
femoral, popliteal and calf veins including the posterior tibial,
peroneal and gastrocnemius veins when visible. The superficial great
saphenous vein was also interrogated. Spectral Doppler was utilized
to evaluate flow at rest and with distal augmentation maneuvers in
the common femoral, femoral and popliteal veins.

[Series 1: us extrem low venous*right* · 0.06mm/px · 13 of 41 slices shown]
[im 1/41]
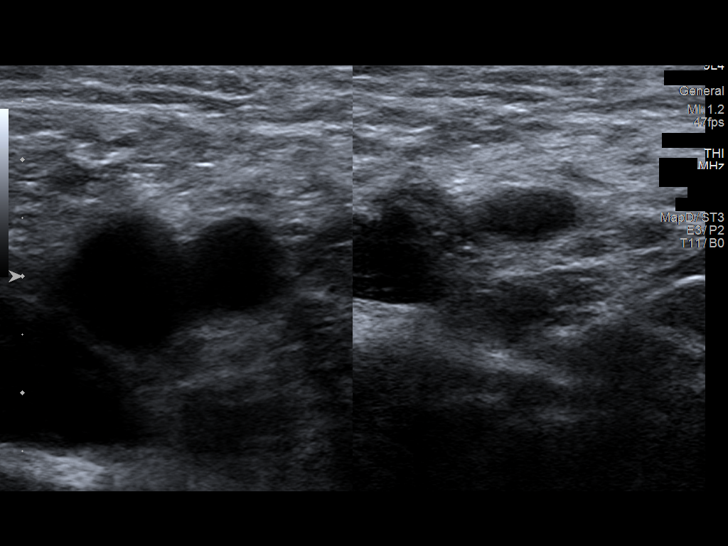
[im 4/41]
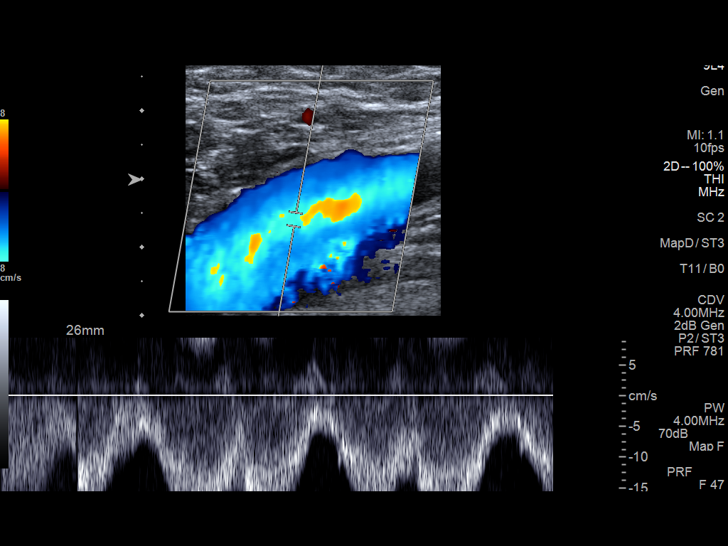
[im 7/41]
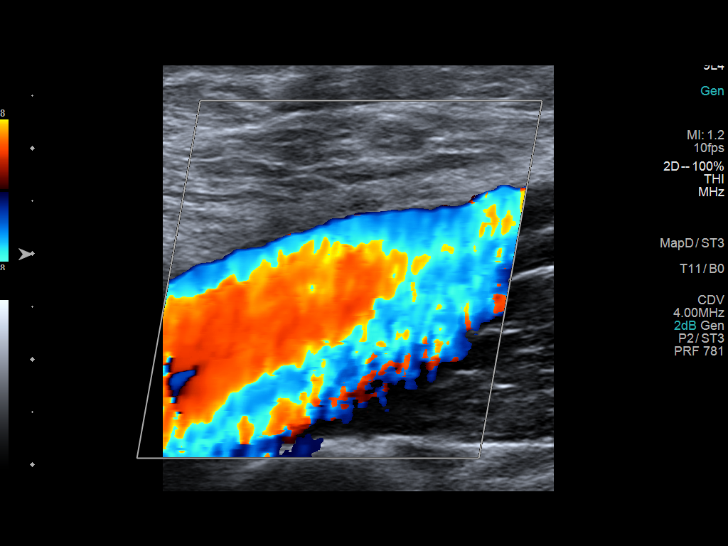
[im 11/41]
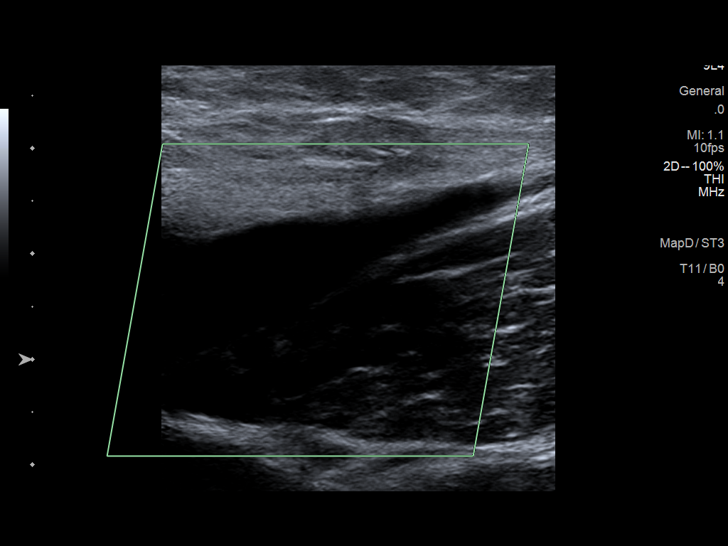
[im 14/41]
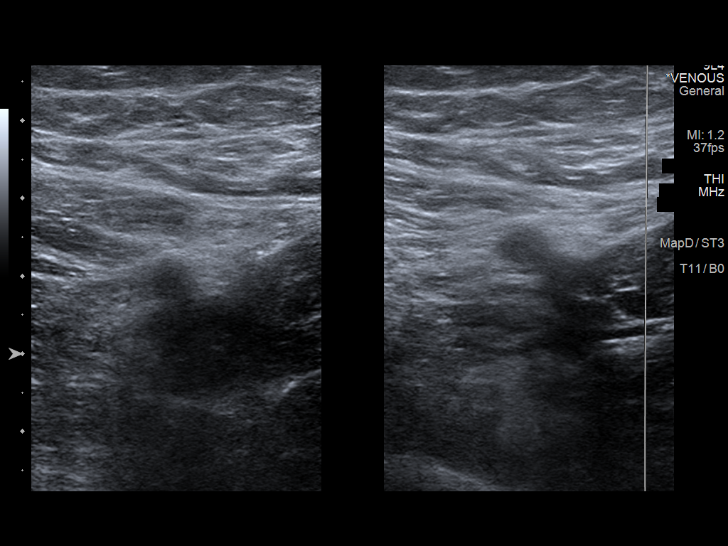
[im 18/41]
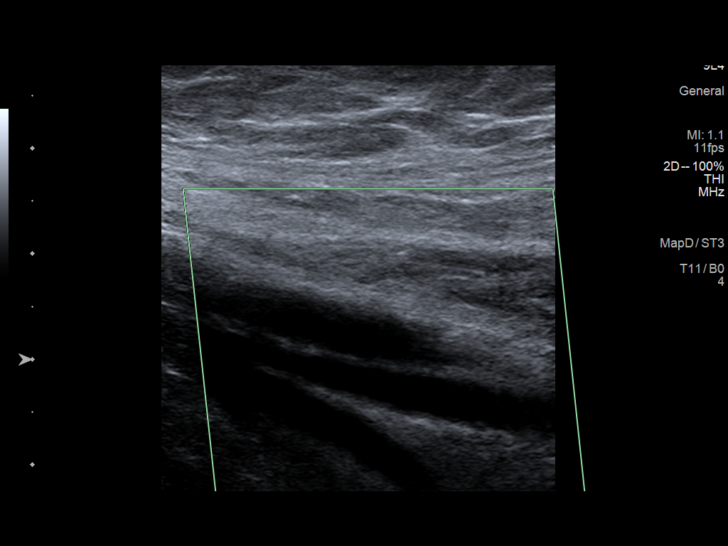
[im 21/41]
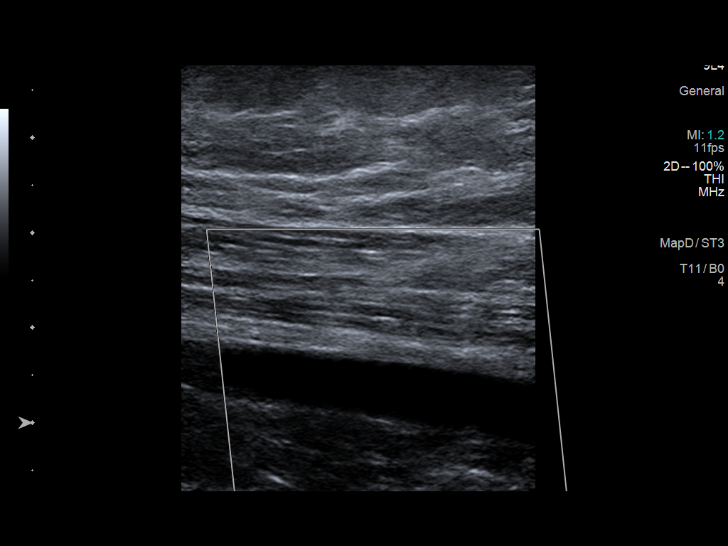
[im 23/41]
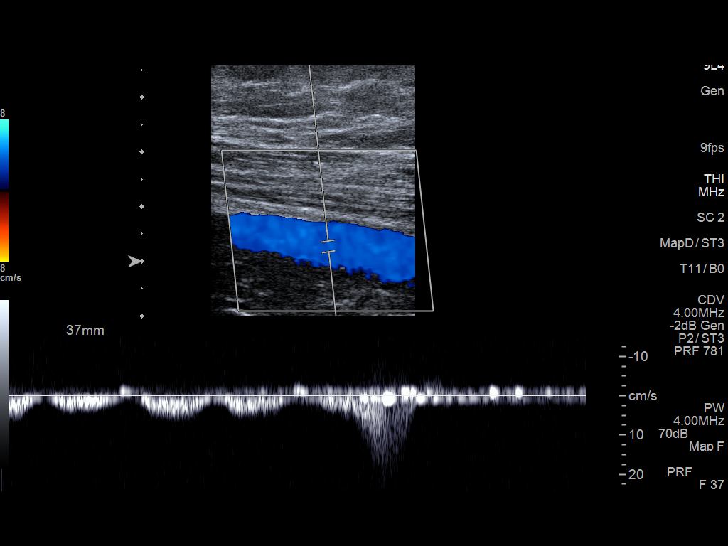
[im 27/41]
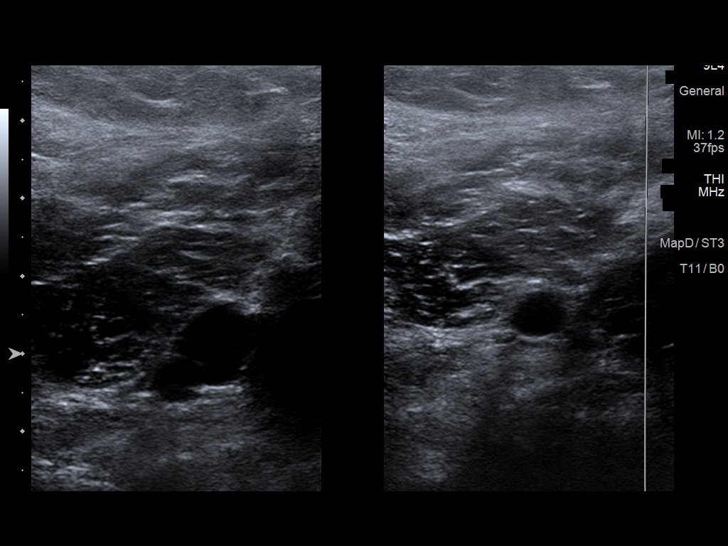
[im 30/41]
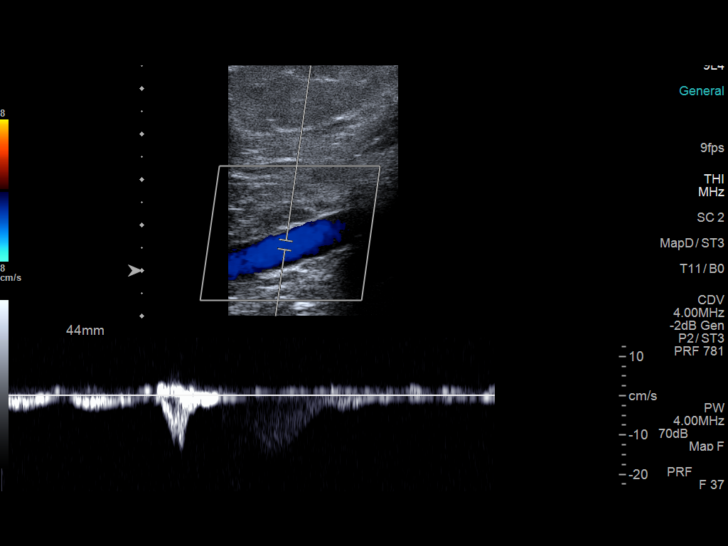
[im 34/41]
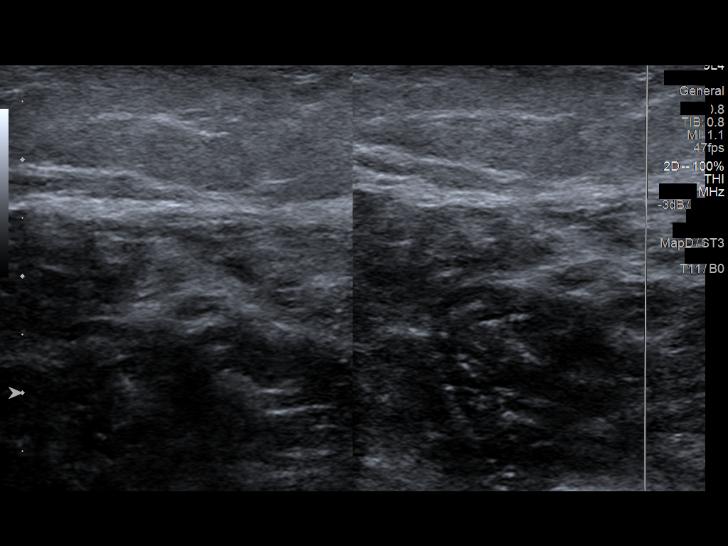
[im 37/41]
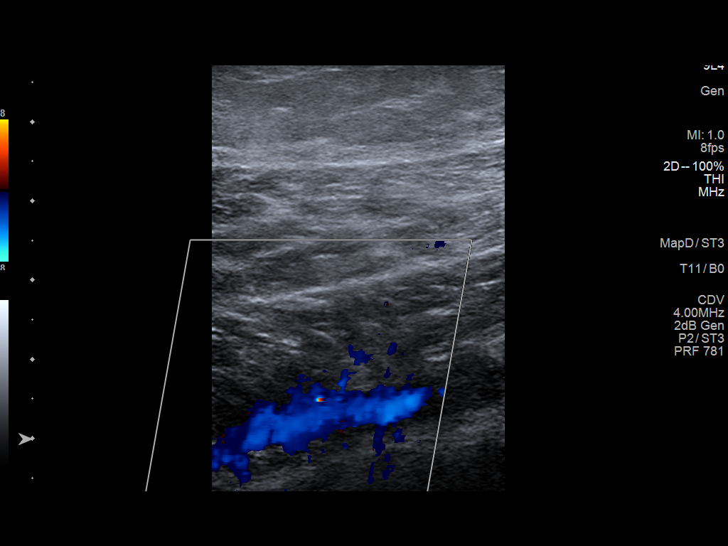
[im 41/41]
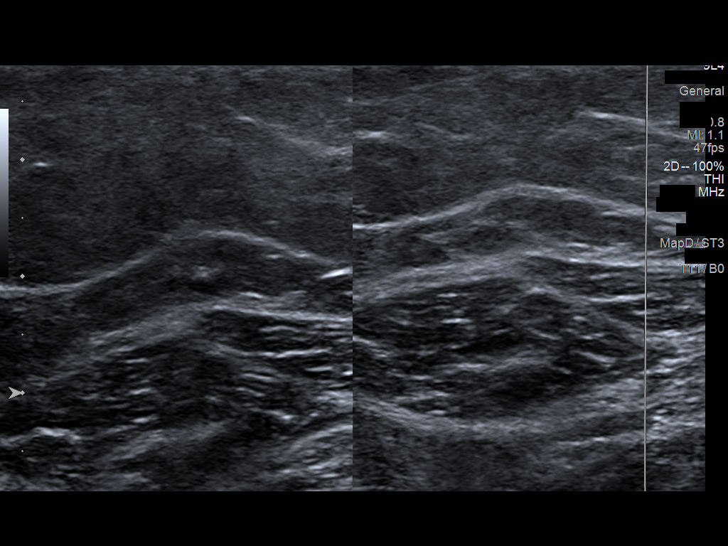

[13 of 24 positions shown; findings below may reference images not displayed]

FINDINGS: Contralateral Common Femoral Vein: Respiratory phasicity is normal
and symmetric with the symptomatic side. No evidence of thrombus.
Normal compressibility.

Common Femoral Vein: No evidence of thrombus. Normal
compressibility, respiratory phasicity and response to augmentation.

Saphenofemoral Junction: No evidence of thrombus. Normal
compressibility and flow on color Doppler imaging.

Profunda Femoral Vein: No evidence of thrombus. Normal
compressibility and flow on color Doppler imaging.

Femoral Vein: No evidence of thrombus. Normal compressibility,
respiratory phasicity and response to augmentation.

Popliteal Vein: No evidence of thrombus. Normal compressibility,
respiratory phasicity and response to augmentation.

Calf Veins: No evidence of thrombus. Normal compressibility and flow
on color Doppler imaging.

Superficial Great Saphenous Vein: No evidence of thrombus. Normal
compressibility.

Venous Reflux:  None.

Other Findings:  None.
IMPRESSION: No evidence of deep venous thrombosis in the right lower extremity.
Left common femoral vein also patent.

## 2021-11-29 ENCOUNTER — Other Ambulatory Visit (HOSPITAL_COMMUNITY): Payer: Self-pay

## 2021-12-28 ENCOUNTER — Other Ambulatory Visit (HOSPITAL_COMMUNITY): Payer: Self-pay

## 2021-12-28 DIAGNOSIS — Z01419 Encounter for gynecological examination (general) (routine) without abnormal findings: Secondary | ICD-10-CM | POA: Diagnosis not present

## 2022-01-31 ENCOUNTER — Other Ambulatory Visit (HOSPITAL_COMMUNITY): Payer: Self-pay

## 2022-01-31 DIAGNOSIS — R5383 Other fatigue: Secondary | ICD-10-CM | POA: Diagnosis not present

## 2022-01-31 MED ORDER — WEGOVY 2.4 MG/0.75ML ~~LOC~~ SOAJ
2.4000 mg | SUBCUTANEOUS | 2 refills | Status: DC
  Filled 2022-01-31: qty 3, 28d supply, fill #0
  Filled 2022-02-16 – 2022-02-26 (×2): qty 3, 28d supply, fill #1
  Filled 2022-03-27: qty 3, 28d supply, fill #2

## 2022-02-16 ENCOUNTER — Other Ambulatory Visit (HOSPITAL_COMMUNITY): Payer: Self-pay

## 2022-03-27 ENCOUNTER — Other Ambulatory Visit: Payer: Self-pay

## 2022-03-28 ENCOUNTER — Other Ambulatory Visit (HOSPITAL_COMMUNITY): Payer: Self-pay

## 2022-03-28 ENCOUNTER — Encounter (HOSPITAL_COMMUNITY): Payer: Self-pay

## 2022-03-31 ENCOUNTER — Other Ambulatory Visit: Payer: Self-pay

## 2022-04-18 ENCOUNTER — Other Ambulatory Visit (HOSPITAL_COMMUNITY): Payer: Self-pay

## 2022-04-18 MED ORDER — ATOVAQUONE-PROGUANIL HCL 250-100 MG PO TABS
1.0000 | ORAL_TABLET | Freq: Every day | ORAL | 0 refills | Status: AC
  Filled 2022-04-18: qty 17, 17d supply, fill #0

## 2022-04-18 MED ORDER — SCOPOLAMINE 1 MG/3DAYS TD PT72
4.0000 | MEDICATED_PATCH | Freq: Once | TRANSDERMAL | 0 refills | Status: AC
  Filled 2022-04-18: qty 4, 12d supply, fill #0

## 2022-04-18 MED ORDER — AZITHROMYCIN 500 MG PO TABS
500.0000 mg | ORAL_TABLET | Freq: Every day | ORAL | 0 refills | Status: AC
  Filled 2022-04-18: qty 4, 3d supply, fill #0

## 2022-04-18 MED ORDER — ONDANSETRON HCL 4 MG PO TABS
4.0000 mg | ORAL_TABLET | Freq: Four times a day (QID) | ORAL | 0 refills | Status: AC | PRN
  Filled 2022-04-18: qty 30, 8d supply, fill #0

## 2022-05-03 ENCOUNTER — Other Ambulatory Visit (HOSPITAL_COMMUNITY): Payer: Self-pay

## 2022-05-03 DIAGNOSIS — R110 Nausea: Secondary | ICD-10-CM | POA: Diagnosis not present

## 2022-05-03 DIAGNOSIS — R5383 Other fatigue: Secondary | ICD-10-CM | POA: Diagnosis not present

## 2022-05-03 MED ORDER — WEGOVY 2.4 MG/0.75ML ~~LOC~~ SOAJ
0.7500 mL | SUBCUTANEOUS | 2 refills | Status: AC
  Filled 2022-05-03: qty 3, 28d supply, fill #0
  Filled 2022-05-15 – 2022-05-26 (×2): qty 3, 28d supply, fill #1
  Filled 2022-06-21 – 2022-06-26 (×4): qty 3, 28d supply, fill #2

## 2022-05-16 ENCOUNTER — Other Ambulatory Visit: Payer: Self-pay

## 2022-05-26 ENCOUNTER — Other Ambulatory Visit (HOSPITAL_COMMUNITY): Payer: Self-pay

## 2022-06-05 ENCOUNTER — Other Ambulatory Visit (HOSPITAL_COMMUNITY): Payer: Self-pay

## 2022-06-05 DIAGNOSIS — E669 Obesity, unspecified: Secondary | ICD-10-CM | POA: Diagnosis not present

## 2022-06-05 DIAGNOSIS — Z0000 Encounter for general adult medical examination without abnormal findings: Secondary | ICD-10-CM | POA: Diagnosis not present

## 2022-06-05 DIAGNOSIS — Z6830 Body mass index (BMI) 30.0-30.9, adult: Secondary | ICD-10-CM | POA: Diagnosis not present

## 2022-06-05 MED ORDER — ETONOGESTREL-ETHINYL ESTRADIOL 0.12-0.015 MG/24HR VA RING: VAGINAL_RING | VAGINAL | 1 refills | Status: AC

## 2022-06-06 ENCOUNTER — Other Ambulatory Visit (HOSPITAL_COMMUNITY): Payer: Self-pay

## 2022-06-21 ENCOUNTER — Other Ambulatory Visit: Payer: Self-pay

## 2022-06-22 ENCOUNTER — Other Ambulatory Visit (HOSPITAL_COMMUNITY): Payer: Self-pay

## 2022-06-23 ENCOUNTER — Other Ambulatory Visit: Payer: Self-pay | Admitting: Obstetrics and Gynecology

## 2022-06-23 DIAGNOSIS — Z1231 Encounter for screening mammogram for malignant neoplasm of breast: Secondary | ICD-10-CM

## 2022-06-26 ENCOUNTER — Other Ambulatory Visit (HOSPITAL_COMMUNITY): Payer: Self-pay

## 2022-07-31 ENCOUNTER — Ambulatory Visit
Admission: RE | Admit: 2022-07-31 | Discharge: 2022-07-31 | Disposition: A | Discharge status: Home / Self Care | Payer: Commercial Managed Care - PPO | Source: Ambulatory Visit | Attending: Obstetrics and Gynecology | Admitting: Obstetrics and Gynecology

## 2022-07-31 DIAGNOSIS — Z1231 Encounter for screening mammogram for malignant neoplasm of breast: Secondary | ICD-10-CM | POA: Diagnosis not present

## 2022-08-08 ENCOUNTER — Other Ambulatory Visit: Payer: Self-pay

## 2022-08-08 ENCOUNTER — Other Ambulatory Visit (HOSPITAL_COMMUNITY): Payer: Self-pay

## 2022-08-08 MED ORDER — SCOPOLAMINE 1 MG/3DAYS TD PT72
MEDICATED_PATCH | TRANSDERMAL | 0 refills | Status: AC
  Filled 2022-08-08: qty 2, 6d supply, fill #0
  Filled 2022-09-13: qty 2, 12d supply, fill #0

## 2022-08-08 MED ORDER — ONDANSETRON 4 MG PO TBDP
4.0000 mg | ORAL_TABLET | Freq: Three times a day (TID) | ORAL | 0 refills | Status: DC
  Filled 2022-08-08 – 2022-09-13 (×2): qty 30, 10d supply, fill #0

## 2022-08-09 ENCOUNTER — Other Ambulatory Visit: Payer: Self-pay

## 2022-08-14 ENCOUNTER — Other Ambulatory Visit: Payer: Self-pay

## 2022-09-13 ENCOUNTER — Other Ambulatory Visit (HOSPITAL_COMMUNITY): Payer: Self-pay

## 2022-11-16 ENCOUNTER — Other Ambulatory Visit: Payer: Self-pay

## 2022-11-16 ENCOUNTER — Encounter: Payer: Self-pay | Admitting: Pharmacist

## 2022-11-16 ENCOUNTER — Other Ambulatory Visit (HOSPITAL_COMMUNITY): Payer: Self-pay

## 2022-11-16 DIAGNOSIS — N760 Acute vaginitis: Secondary | ICD-10-CM | POA: Diagnosis not present

## 2022-11-16 MED ORDER — FLUCONAZOLE 150 MG PO TABS
150.0000 mg | ORAL_TABLET | Freq: Once | ORAL | 0 refills | Status: AC
  Filled 2022-11-16 (×2): qty 2, 4d supply, fill #0

## 2022-11-17 ENCOUNTER — Other Ambulatory Visit: Payer: Self-pay

## 2022-11-17 ENCOUNTER — Other Ambulatory Visit (HOSPITAL_COMMUNITY): Payer: Self-pay

## 2022-11-17 ENCOUNTER — Encounter: Payer: Self-pay | Admitting: Pharmacist

## 2022-11-17 MED ORDER — FLUTICASONE PROPIONATE 50 MCG/ACT NA SUSP
1.0000 | Freq: Every day | NASAL | 3 refills | Status: AC
  Filled 2022-11-17 – 2023-08-10 (×2): qty 48, 90d supply, fill #0

## 2022-11-21 ENCOUNTER — Other Ambulatory Visit: Payer: Self-pay

## 2023-01-15 ENCOUNTER — Other Ambulatory Visit (HOSPITAL_COMMUNITY): Payer: Self-pay

## 2023-01-15 DIAGNOSIS — Z113 Encounter for screening for infections with a predominantly sexual mode of transmission: Secondary | ICD-10-CM | POA: Diagnosis not present

## 2023-01-15 DIAGNOSIS — Z131 Encounter for screening for diabetes mellitus: Secondary | ICD-10-CM | POA: Diagnosis not present

## 2023-01-15 DIAGNOSIS — N926 Irregular menstruation, unspecified: Secondary | ICD-10-CM | POA: Diagnosis not present

## 2023-01-15 DIAGNOSIS — Z01419 Encounter for gynecological examination (general) (routine) without abnormal findings: Secondary | ICD-10-CM | POA: Diagnosis not present

## 2023-01-15 MED ORDER — NORETHINDRONE ACETATE 5 MG PO TABS
5.0000 mg | ORAL_TABLET | Freq: Every day | ORAL | 1 refills | Status: AC
  Filled 2023-01-15: qty 30, 30d supply, fill #0
  Filled 2023-08-10: qty 30, 30d supply, fill #1

## 2023-01-23 DIAGNOSIS — N938 Other specified abnormal uterine and vaginal bleeding: Secondary | ICD-10-CM | POA: Diagnosis not present

## 2023-03-26 ENCOUNTER — Other Ambulatory Visit (HOSPITAL_BASED_OUTPATIENT_CLINIC_OR_DEPARTMENT_OTHER): Payer: Self-pay

## 2023-03-26 MED ORDER — ONDANSETRON HCL 4 MG PO TABS
4.0000 mg | ORAL_TABLET | Freq: Every day | ORAL | 0 refills | Status: AC | PRN
  Filled 2023-03-26: qty 15, 15d supply, fill #0

## 2023-04-05 ENCOUNTER — Other Ambulatory Visit (HOSPITAL_BASED_OUTPATIENT_CLINIC_OR_DEPARTMENT_OTHER): Payer: Self-pay

## 2023-05-29 ENCOUNTER — Other Ambulatory Visit (HOSPITAL_COMMUNITY): Payer: Self-pay

## 2023-05-29 MED ORDER — BICTEGRAVIR-EMTRICITAB-TENOFOV 50-200-25 MG PO TABS
1.0000 | ORAL_TABLET | Freq: Every day | ORAL | 0 refills | Status: AC
  Filled 2023-05-29: qty 30, 30d supply, fill #0

## 2023-06-11 DIAGNOSIS — Z0000 Encounter for general adult medical examination without abnormal findings: Secondary | ICD-10-CM | POA: Diagnosis not present

## 2023-07-11 ENCOUNTER — Other Ambulatory Visit: Payer: Self-pay | Admitting: Family Medicine

## 2023-07-11 DIAGNOSIS — Z1231 Encounter for screening mammogram for malignant neoplasm of breast: Secondary | ICD-10-CM

## 2023-08-02 ENCOUNTER — Ambulatory Visit
Admission: RE | Admit: 2023-08-02 | Discharge: 2023-08-02 | Disposition: A | Discharge status: Home / Self Care | Source: Ambulatory Visit | Attending: Family Medicine | Admitting: Family Medicine

## 2023-08-02 DIAGNOSIS — Z1231 Encounter for screening mammogram for malignant neoplasm of breast: Secondary | ICD-10-CM

## 2023-08-02 IMAGING — MG MM DIGITAL SCREENING BILAT W/ TOMO AND CAD
8 series · 8 of 24 positions shown · non-contrast
Comparison: Previous exam(s).

CLINICAL DATA: Screening.

EXAM:
DIGITAL SCREENING BILATERAL MAMMOGRAM WITH TOMOSYNTHESIS AND CAD
TECHNIQUE: Bilateral screening digital craniocaudal and mediolateral oblique
mammograms were obtained. Bilateral screening digital breast
tomosynthesis was performed. The images were evaluated with
computer-aided detection.

[L MLO synth-2D]
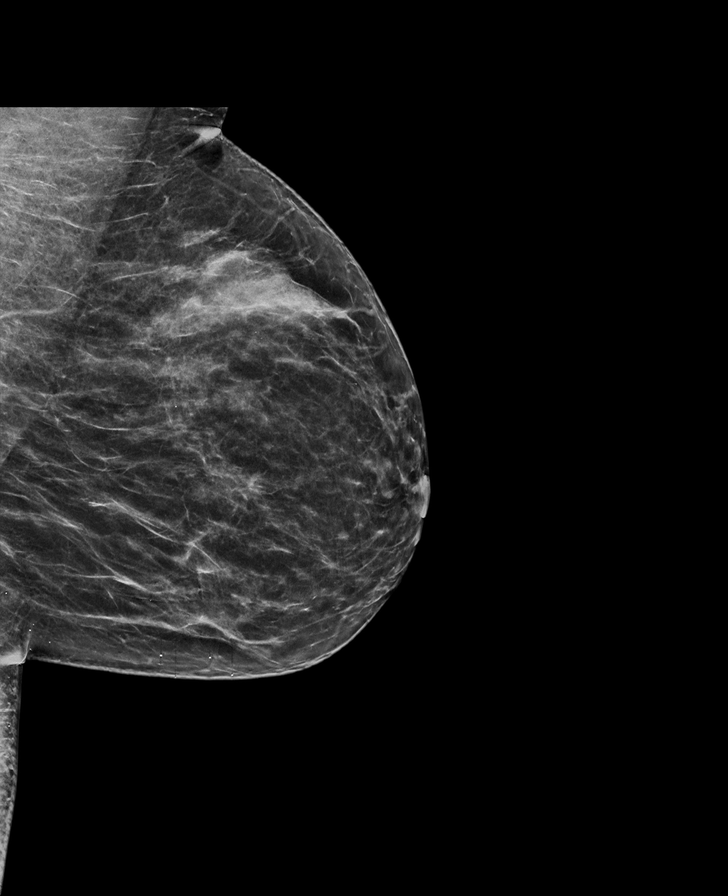

[L CC synth-2D]
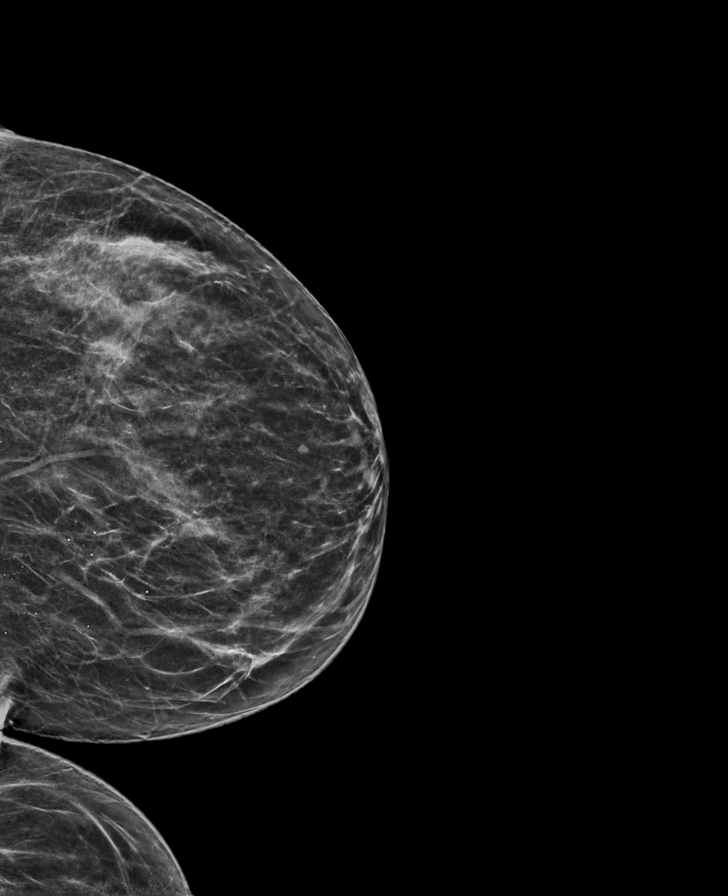

[R CC synth-2D]
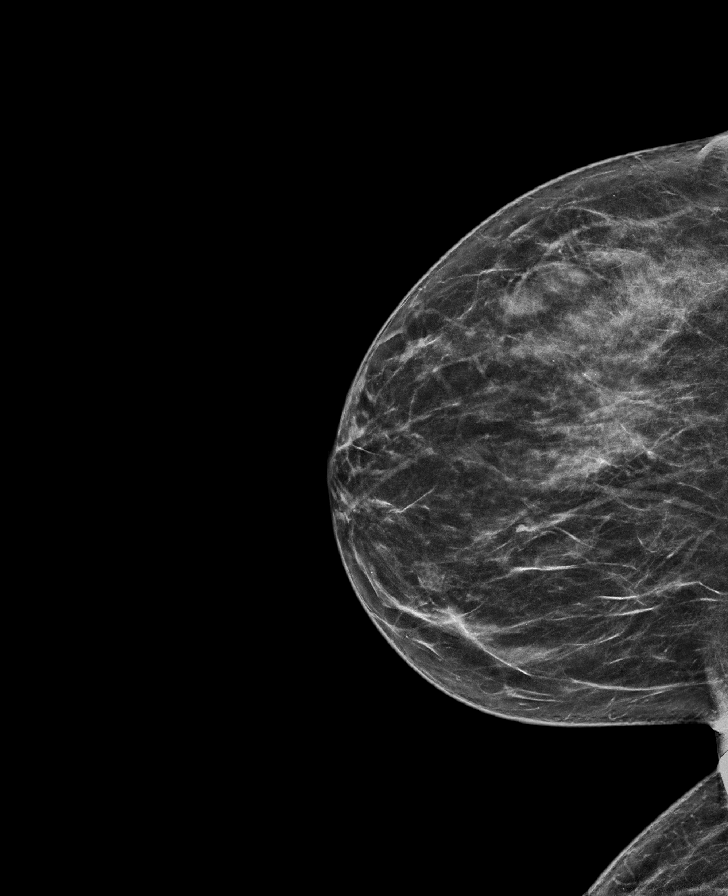

[R MLO synth-2D]
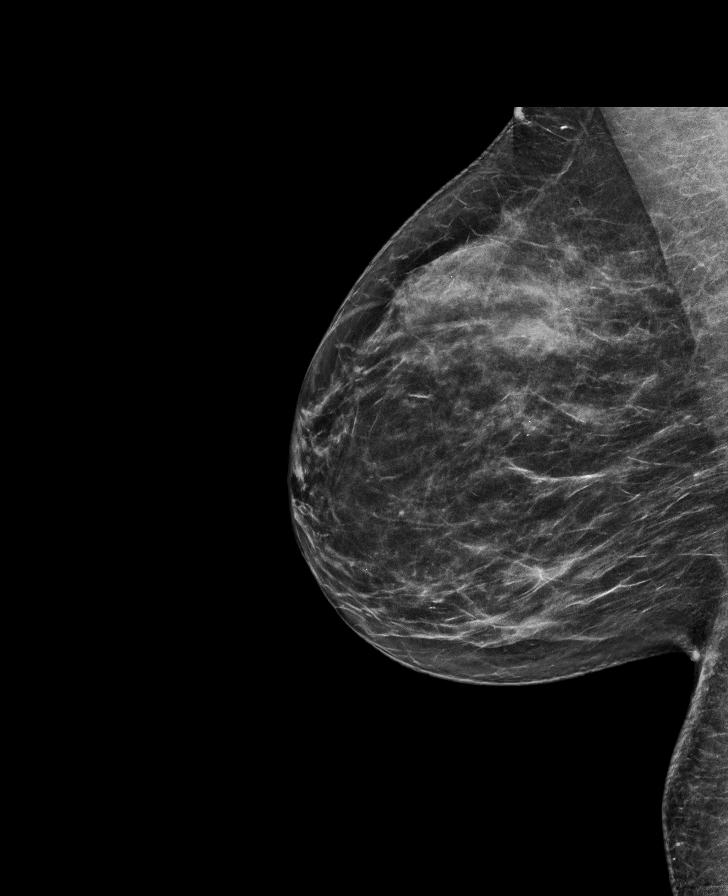

[R CC tomo · tomo slice 33/65.0]
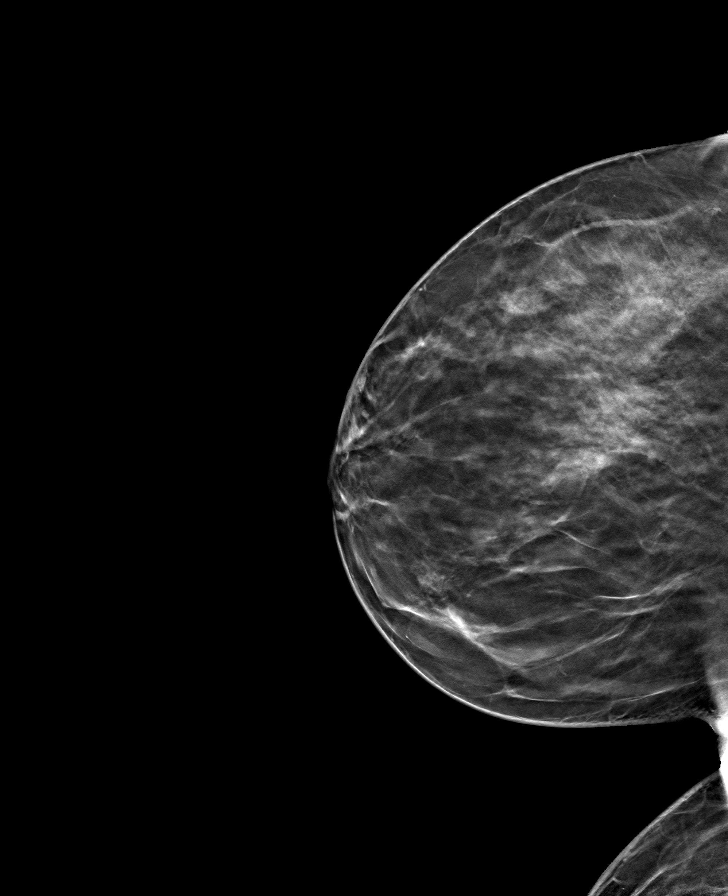

[R MLO tomo · tomo slice 35/68.0]
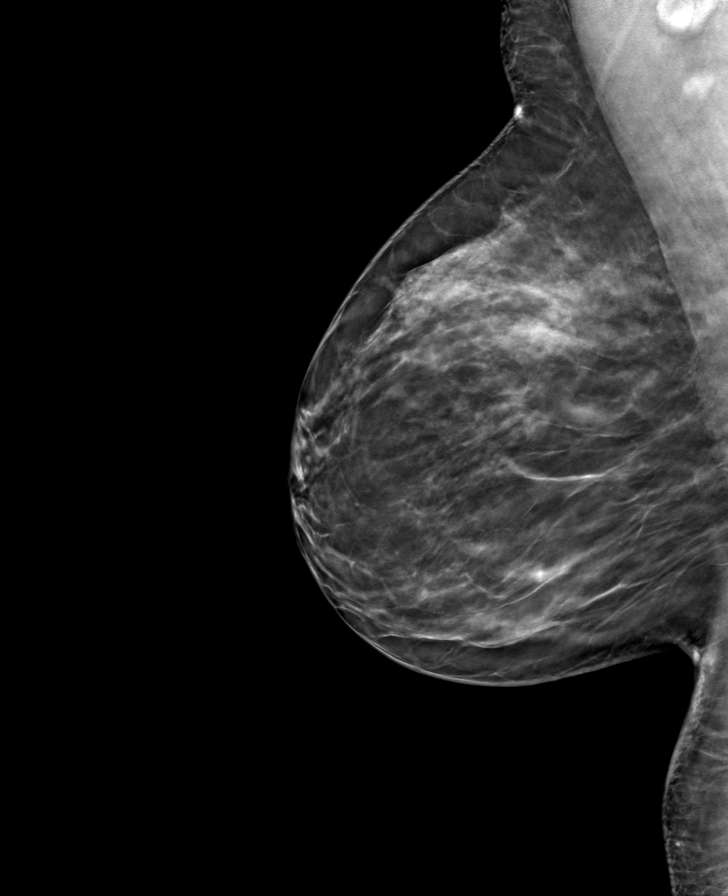

[L CC tomo · tomo slice 31/60.0]
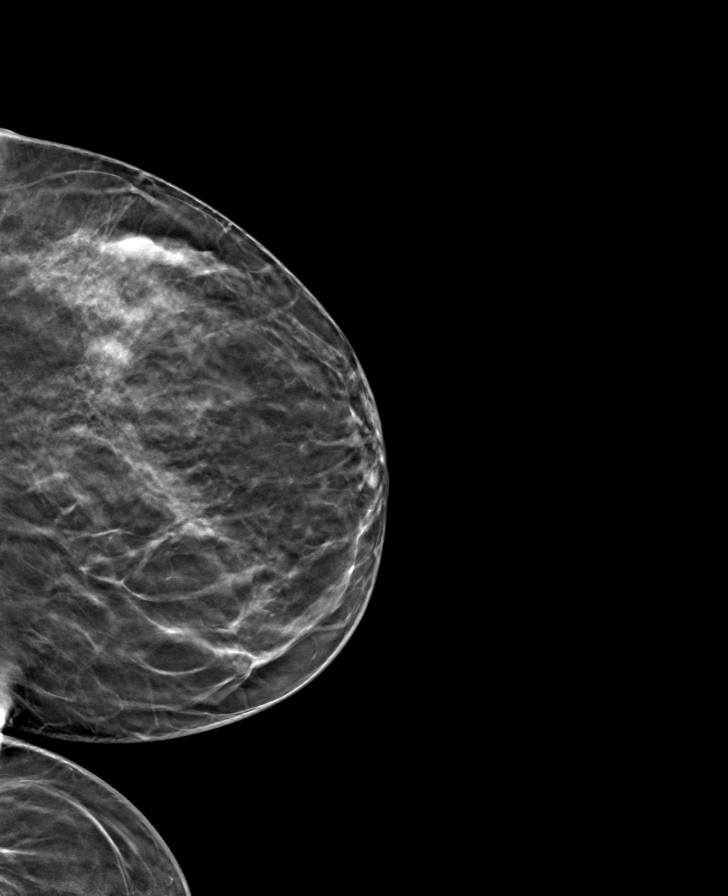

[L MLO tomo · tomo slice 33/66.0]
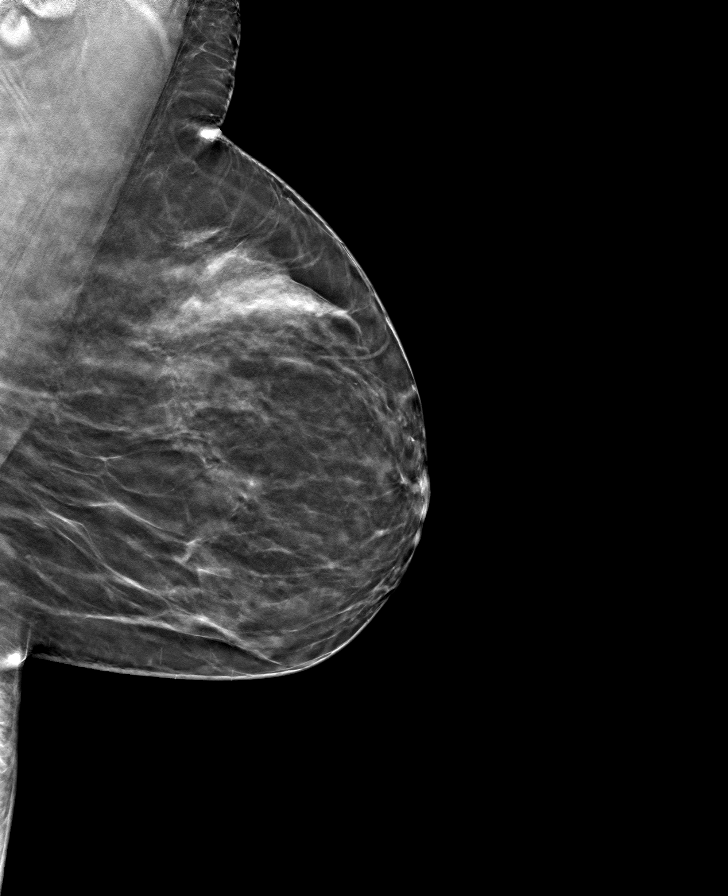

[8 of 24 positions shown; findings below may reference images not displayed]

ACR Breast Density Category b: There are scattered areas of
fibroglandular density.
FINDINGS: There are no findings suspicious for malignancy.
IMPRESSION: No mammographic evidence of malignancy. A result letter of this
screening mammogram will be mailed directly to the patient.

RECOMMENDATION:
Screening mammogram in one year. (Code:51-O-LD2)

BI-RADS CATEGORY  1: Negative.

## 2023-08-10 ENCOUNTER — Encounter: Payer: Self-pay | Admitting: Pharmacist

## 2023-08-10 ENCOUNTER — Other Ambulatory Visit: Payer: Self-pay

## 2023-08-10 ENCOUNTER — Other Ambulatory Visit (HOSPITAL_BASED_OUTPATIENT_CLINIC_OR_DEPARTMENT_OTHER): Payer: Self-pay

## 2023-08-10 ENCOUNTER — Other Ambulatory Visit (HOSPITAL_COMMUNITY): Payer: Self-pay

## 2023-10-08 ENCOUNTER — Other Ambulatory Visit: Payer: Self-pay | Admitting: Nurse Practitioner

## 2023-10-08 ENCOUNTER — Other Ambulatory Visit: Payer: Self-pay | Admitting: Primary Care

## 2023-10-08 DIAGNOSIS — N63 Unspecified lump in unspecified breast: Secondary | ICD-10-CM

## 2023-10-08 DIAGNOSIS — N6311 Unspecified lump in the right breast, upper outer quadrant: Secondary | ICD-10-CM | POA: Diagnosis not present

## 2023-10-18 ENCOUNTER — Other Ambulatory Visit

## 2023-10-18 ENCOUNTER — Ambulatory Visit
Admission: RE | Admit: 2023-10-18 | Discharge: 2023-10-18 | Disposition: A | Discharge status: Home / Self Care | Source: Ambulatory Visit | Attending: Nurse Practitioner | Admitting: Nurse Practitioner

## 2023-10-18 ENCOUNTER — Encounter

## 2023-10-18 DIAGNOSIS — N63 Unspecified lump in unspecified breast: Secondary | ICD-10-CM

## 2023-10-18 DIAGNOSIS — R928 Other abnormal and inconclusive findings on diagnostic imaging of breast: Secondary | ICD-10-CM | POA: Diagnosis not present

## 2023-10-18 DIAGNOSIS — N6001 Solitary cyst of right breast: Secondary | ICD-10-CM | POA: Diagnosis not present

## 2023-11-22 ENCOUNTER — Other Ambulatory Visit (HOSPITAL_BASED_OUTPATIENT_CLINIC_OR_DEPARTMENT_OTHER): Payer: Self-pay

## 2023-11-22 MED ORDER — FLUZONE 0.5 ML IM SUSY
0.5000 mL | PREFILLED_SYRINGE | Freq: Once | INTRAMUSCULAR | 0 refills | Status: AC
Start: 2023-11-22 — End: 2023-11-23
  Filled 2023-11-22: qty 0.5, 1d supply, fill #0

## 2023-12-07 ENCOUNTER — Other Ambulatory Visit (HOSPITAL_BASED_OUTPATIENT_CLINIC_OR_DEPARTMENT_OTHER): Payer: Self-pay

## 2023-12-07 MED ORDER — BISACODYL 5 MG PO TBEC
DELAYED_RELEASE_TABLET | ORAL | 0 refills | Status: AC
  Filled 2023-12-07: qty 4, 1d supply, fill #0

## 2023-12-07 MED ORDER — PEG 3350-KCL-NA BICARB-NACL 420 G PO SOLR
ORAL | 0 refills | Status: AC
  Filled 2023-12-07: qty 4000, 2d supply, fill #0

## 2023-12-19 DIAGNOSIS — Z1211 Encounter for screening for malignant neoplasm of colon: Secondary | ICD-10-CM | POA: Diagnosis not present

## 2023-12-19 DIAGNOSIS — D120 Benign neoplasm of cecum: Secondary | ICD-10-CM | POA: Diagnosis not present

## 2024-01-22 DIAGNOSIS — N921 Excessive and frequent menstruation with irregular cycle: Secondary | ICD-10-CM | POA: Diagnosis not present

## 2024-01-22 DIAGNOSIS — Z01419 Encounter for gynecological examination (general) (routine) without abnormal findings: Secondary | ICD-10-CM | POA: Diagnosis not present
